# Patient Record
Sex: Male | Born: 1955
Health system: Southern US, Community
[De-identification: ages and names within clinical notes are randomized; demographics above are authoritative.]

## PROBLEM LIST (undated history)

## (undated) DIAGNOSIS — H269 Unspecified cataract: Secondary | ICD-10-CM

## (undated) HISTORY — PX: EYE SURGERY: SHX253

## (undated) HISTORY — PX: NOSE SURGERY: SHX723

## (undated) HISTORY — DX: Unspecified cataract: H26.9

---

## 2011-05-11 ENCOUNTER — Ambulatory Visit (INDEPENDENT_AMBULATORY_CARE_PROVIDER_SITE_OTHER): Payer: BC Managed Care – PPO | Admitting: Family Medicine

## 2011-05-11 ENCOUNTER — Ambulatory Visit
Admission: RE | Admit: 2011-05-11 | Discharge: 2011-05-11 | Disposition: A | Discharge status: Home / Self Care | Payer: Self-pay | Source: Ambulatory Visit | Attending: Family Medicine | Admitting: Family Medicine

## 2011-05-11 DIAGNOSIS — R1084 Generalized abdominal pain: Secondary | ICD-10-CM

## 2011-05-11 DIAGNOSIS — Z0000 Encounter for general adult medical examination without abnormal findings: Secondary | ICD-10-CM

## 2011-05-11 LAB — COMPREHENSIVE METABOLIC PANEL
ALT: 22 U/L (ref 0–53)
AST: 21 U/L (ref 0–37)
Albumin: 4.9 g/dL (ref 3.5–5.2)
Alkaline Phosphatase: 50 U/L (ref 39–117)
BUN: 10 mg/dL (ref 6–23)
CO2: 30 mEq/L (ref 19–32)
Calcium: 10.2 mg/dL (ref 8.4–10.5)
Chloride: 104 mEq/L (ref 96–112)
Creat: 0.91 mg/dL (ref 0.50–1.35)
Glucose, Bld: 100 mg/dL — ABNORMAL HIGH (ref 70–99)
Potassium: 4.8 mEq/L (ref 3.5–5.3)
Sodium: 142 mEq/L (ref 135–145)
Total Bilirubin: 0.4 mg/dL (ref 0.3–1.2)
Total Protein: 7.1 g/dL (ref 6.0–8.3)

## 2011-05-11 LAB — POCT CBC
Granulocyte percent: 67.9 %G (ref 37–80)
HCT, POC: 47.2 % (ref 43.5–53.7)
Hemoglobin: 15.5 g/dL (ref 14.1–18.1)
Lymph, poc: 2.1 (ref 0.6–3.4)
MCH, POC: 30.4 pg (ref 27–31.2)
MCHC: 32.8 g/dL (ref 31.8–35.4)
MCV: 92.5 fL (ref 80–97)
MID (cbc): 0.4 (ref 0–0.9)
MPV: 10.7 fL (ref 0–99.8)
POC Granulocyte: 5.4 (ref 2–6.9)
POC LYMPH PERCENT: 26.5 %L (ref 10–50)
POC MID %: 5.6 %M (ref 0–12)
Platelet Count, POC: 312 10*3/uL (ref 142–424)
RBC: 5.1 M/uL (ref 4.69–6.13)
RDW, POC: 12.3 %
WBC: 7.9 10*3/uL (ref 4.6–10.2)

## 2011-05-11 LAB — LIPID PANEL
Cholesterol: 183 mg/dL (ref 0–200)
HDL: 40 mg/dL (ref >39)
LDL Cholesterol: 96 mg/dL (ref 0–99)
Total CHOL/HDL Ratio: 4.6 Ratio
Triglycerides: 233 mg/dL — ABNORMAL HIGH (ref <150)
VLDL: 47 mg/dL — ABNORMAL HIGH (ref 0–40)

## 2011-05-11 LAB — AMYLASE: Amylase: 48 U/L (ref 0–105)

## 2011-05-11 LAB — LIPASE: Lipase: 18 U/L (ref 0–75)

## 2011-05-11 MED ORDER — IOHEXOL 300 MG/ML  SOLN
100.0000 mL | Freq: Once | INTRAMUSCULAR | Status: AC | PRN
  Administered 2011-05-11: 100 mL via INTRAVENOUS

## 2011-05-11 MED ORDER — IOHEXOL 300 MG/ML  SOLN
30.0000 mL | Freq: Once | INTRAMUSCULAR | Status: AC | PRN
  Administered 2011-05-11: 30 mL via ORAL

## 2011-05-11 NOTE — Progress Notes (Signed)
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 Developing began about one month ago and occurs episodically. The pain is quite intense and is in the periumbilical area lasts anywhere from a few minutes to over an hour and is quite intense stopping him from working., And its associated with some nausea. He has not vomited. He's had no change in his bowel Pattern.  He's had no colonoscopy in the past. This man never complains about his medical problems and is the first medical visit a long time for him. Subsequent abdominal pain becoming more intense, more frequent and are now occurring almost every day.:   Objective: Healthy appearing adult male in no acute distress. He does appear worries.  HEENT: Unremarkable  Heart: Regular no murmur  Chest: Clear no rhonchi or rales.  Abdomen: Patient is a very small umbilical hernia which is nontender. He is however tender just superior to the umbilicus and there is fullness was perception of a deep irregular mass in the left central abdomen. Can't tell if this is bowel or for some other structure. He has no rebound and no guarding.  Results for orders placed in visit on 05/11/11  POCT CBC      Component Value Range   WBC 7.9  4.6 - 10.2 (K/uL)   Lymph, poc 2.1  0.6 - 3.4    POC LYMPH PERCENT 26.5  10 - 50 (%L)   MID (cbc) 0.4  0 - 0.9    POC MID % 5.6  0 - 12 (%M)   POC Granulocyte 5.4  2 - 6.9    Granulocyte percent 67.9  37 - 80 (%G)   RBC 5.10  4.69 - 6.13 (M/uL)   Hemoglobin 15.5  14.1 - 18.1 (g/dL)   HCT, POC 65.7  84.6 - 53.7 (%)   MCV 92.5  80 - 97 (fL)   MCH, POC 30.4  27 - 31.2 (pg)   MCHC 32.8  31.8 - 35.4 (g/dL)   RDW, POC 96.2     Platelet Count, POC 312  142 - 424 (K/uL)   MPV 10.7  0 - 99.8 (fL)   A:  I am very concerned with the crescendo nature of these episodes in the context of a very healthy man with no known risk factors for GI disease.  P:  CT abd/pelvis with contrast today Nexium Rx  written Amylase and lipase, CMET  pending

## 2011-05-11 NOTE — Progress Notes (Signed)
  Subjective:    Patient ID: David [name], male    DOB: [birth year], 56 y.o.   MRN: [id number]  HPI    Review of Systems     Objective:   Physical Exam        Assessment & Plan:   Got call report from [facility] - reviewed and gave pt results.  Pt to get nexium filled and I will have Dr Milus Glazier call him with next step.

## 2011-05-12 ENCOUNTER — Telehealth: Payer: Self-pay

## 2011-05-12 LAB — H. PYLORI ANTIBODY, IGG: H Pylori IgG: <0.4 {ISR}

## 2011-05-12 NOTE — Telephone Encounter (Signed)
Patient given normal results of CT and blood work.  He is comfortable and has started his Nexium.  He will get culturelle and start bid.  He will call if symptoms recur.

## 2011-05-12 NOTE — Telephone Encounter (Signed)
JESSICA FROM [facility] STATES THEY HAVE TO CALL IN REQUEST SINCE THIS IS A NEW RXN. PLEASE CALL [phone] AND PT WOULD LIKE NEXIUM 40MG S

## 2017-12-18 ENCOUNTER — Encounter: Payer: Self-pay | Admitting: Family Medicine

## 2017-12-18 ENCOUNTER — Other Ambulatory Visit: Payer: Self-pay

## 2017-12-18 ENCOUNTER — Ambulatory Visit (INDEPENDENT_AMBULATORY_CARE_PROVIDER_SITE_OTHER): Payer: BLUE CROSS/BLUE SHIELD

## 2017-12-18 ENCOUNTER — Ambulatory Visit: Payer: BLUE CROSS/BLUE SHIELD | Admitting: Family Medicine

## 2017-12-18 VITALS — BP 138/87 | HR 67 | Temp 98.5°F | Ht 71.0 in | Wt 172.4 lb

## 2017-12-18 DIAGNOSIS — M545 Low back pain, unspecified: Secondary | ICD-10-CM

## 2017-12-18 MED ORDER — CYCLOBENZAPRINE HCL 10 MG PO TABS: 5.0000 mg | ORAL_TABLET | Freq: Three times a day (TID) | ORAL | 0 refills | Status: DC | PRN

## 2017-12-18 NOTE — Progress Notes (Signed)
11/5/20194:15 PM  David [name], David [name], 62 y.o. [name] [id number]  Chief Complaint  Patient presents with  . Back Pain     hurt lifting at home 3 wks ago. Felt somewhat of a popping when it happened. Takes motrin for the pain. Does nightly exercise that may have irritated the pain more    HPI:   Patient is a 62 y.o. [name]  who presents today for back pain  About 3-4 weeks ago lifted a boat motor and wen he overstretched he heard a "pop" and since then having pain left lower back Also was doing exercise every night, has stopped as he was worried it might be making things worse Sitting makes it worse No radiating pain, no numbness, tingling, weakness, changes in bowel or bladder function ibu 800mg  and heating pads helps Has done this before in the past but resolved in a week   Fall Risk  12/18/2017  Falls in the past year? 0     No flowsheet data found.  No Known Allergies  Prior to Admission medications   Not on File    No past medical history on file.  History reviewed. No pertinent surgical history.  Social History   Tobacco Use  . Smoking status: Never Smoker  . Smokeless tobacco: Never Used  Substance Use Topics  . Alcohol use: Never    Frequency: Never    Family History  Problem Relation Age of Onset  . Hyperlipidemia Mother   . Alzheimer's disease Mother   . Diabetes Father   . Hyperlipidemia Father     ROS Per hpi  OBJECTIVE:  Blood pressure 138/87, pulse 67, temperature 98.5 F (36.9 C), temperature source Oral, height 5\' 11"  (1.803 m), weight 172 lb 6.4 oz (78.2 kg), SpO2 97 %. Body mass index is 24.04 kg/m.   Physical Exam  Constitutional: He is oriented to person, place, and time. He appears well-developed and well-nourished.  HENT:  Head: Normocephalic and atraumatic.  Right Ear: Hearing, tympanic membrane, external ear and ear canal normal.  Left Ear: Hearing, tympanic membrane, external ear and ear canal normal.  Mouth/Throat:  Oropharynx is clear and moist. No oropharyngeal exudate.  Eyes: Pupils are equal, round, and reactive to light. Conjunctivae and EOM are normal.  Neck: Neck supple. No thyromegaly present.  Cardiovascular: Normal rate, regular rhythm, normal heart sounds and intact distal pulses. Exam reveals no gallop and no friction rub.  No murmur heard. Pulmonary/Chest: Effort normal and breath sounds normal. He has no wheezes. He has no rhonchi. He has no rales.  Abdominal: Soft. Bowel sounds are normal. He exhibits no distension and no mass. There is no tenderness.  Musculoskeletal: He exhibits no edema.       Thoracic back: Normal.       Lumbar back: He exhibits tenderness (left lower back). He exhibits normal range of motion, no bony tenderness and no spasm.  Lymphadenopathy:    He has no cervical adenopathy.  Neurological: He is alert and oriented to person, place, and time. He has normal strength and normal reflexes. Gait normal.  Neg BLE SLR  Skin: Skin is warm and dry.  Psychiatric: He has a normal mood and affect.  Nursing note and vitals reviewed.    Dg Lumbar Spine Complete  Result Date: 12/18/2017 CLINICAL DATA:  Low back pain over the last 4 weeks following minor trauma. EXAM: LUMBAR SPINE - COMPLETE 4+ VIEW COMPARISON:  CT 05/11/2011 FINDINGS: Five lumbar type vertebral bodies show normal  alignment. No disc space narrowing. No evidence of fracture. No significant facet arthropathy. No pars defect. Sacroiliac joints appear normal. IMPRESSION: Negative radiographs.  No abnormality seen to explain pain. Electronically Signed   By: Nelson Chimes M.D.   On: 12/18/2017 16:49     ASSESSMENT and PLAN  1. Left-sided low back pain without sciatica, unspecified chronicity Discussed supportive measures, new meds r/se/b and RTC precautions. Patient educational handout given. - DG Lumbar Spine Complete; Future  Other orders - cyclobenzaprine (FLEXERIL) 10 MG tablet; Take 0.5-1 tablets (5-10 mg  total) by mouth 3 (three) times daily as needed for muscle spasms.  Return if symptoms worsen or fail to improve.    Rutherford Guys, MD [facility] [place], [place] [address] Ph.  [phone] Fax [phone]

## 2017-12-18 NOTE — Patient Instructions (Addendum)
If you have lab work done today you will be contacted with your lab results within the next 2 weeks.  If you have not heard from [place] then please contact us. The fastest way to get your results is to register for My Chart.   IF you received an x-ray today, you will receive an invoice from [facility]. Please contact [facility] at [phone] with questions or concerns regarding your invoice.   IF you received labwork today, you will receive an invoice from [place]. Please contact [facility] at [phone] with questions or concerns regarding your invoice.   Our billing staff will not be able to assist you with questions regarding bills from these companies.  You will be contacted with the lab results as soon as they are available. The fastest way to get your results is to activate your My Chart account. Instructions are located on the last page of this paperwork. If you have not heard from [place] regarding the results in 2 weeks, please contact this office.     Low Back Strain Rehab Ask your health care provider which exercises are safe for you. Do exercises exactly as told by your health care provider and adjust them as directed. It is normal to feel mild stretching, pulling, tightness, or discomfort as you do these exercises, but you should stop right away if you feel sudden pain or your pain gets worse. Do not begin these exercises until told by your health care provider. Stretching and range of motion exercises These exercises warm up your muscles and joints and improve the movement and flexibility of your back. These exercises also help to relieve pain, numbness, and tingling. Exercise A: Single knee to chest  1. Lie on your back on a firm surface with both legs straight. 2. Bend one of your knees. Use your hands to move your knee up toward your chest until you feel a gentle stretch in your lower back and buttock. ? Hold your leg in this position by holding onto the  front of your knee. ? Keep your other leg as straight as possible. 3. Hold for __________ seconds. 4. Slowly return to the starting position. 5. Repeat with your other leg. Repeat __________ times. Complete this exercise __________ times a day. Exercise B: Prone extension on elbows  1. Lie on your abdomen on a firm surface. 2. Prop yourself up on your elbows. 3. Use your arms to help lift your chest up until you feel a gentle stretch in your abdomen and your lower back. ? This will place some of your body weight on your elbows. If this is uncomfortable, try stacking pillows under your chest. ? Your hips should stay down, against the surface that you are lying on. Keep your hip and back muscles relaxed. 4. Hold for __________ seconds. 5. Slowly relax your upper body and return to the starting position. Repeat __________ times. Complete this exercise __________ times a day. Strengthening exercises These exercises build strength and endurance in your back. Endurance is the ability to use your muscles for a long time, even after they get tired. Exercise C: Pelvic tilt 1. Lie on your back on a firm surface. Bend your knees and keep your feet flat. 2. Tense your abdominal muscles. Tip your pelvis up toward the ceiling and flatten your lower back into the floor. ? To help with this exercise, you may place a small towel under your lower back and try to push your back into the  towel. 3. Hold for __________ seconds. 4. Let your muscles relax completely before you repeat this exercise. Repeat __________ times. Complete this exercise __________ times a day. Exercise D: Alternating arm and leg raises  1. Get on your hands and knees on a firm surface. If you are on a hard floor, you may want to use padding to cushion your knees, such as an exercise mat. 2. Line up your arms and legs. Your hands should be below your shoulders, and your knees should be below your hips. 3. Lift your left leg behind you.  At the same time, raise your right arm and straighten it in front of you. ? Do not lift your leg higher than your hip. ? Do not lift your arm higher than your shoulder. ? Keep your abdominal and back muscles tight. ? Keep your hips facing the ground. ? Do not arch your back. ? Keep your balance carefully, and do not hold your breath. 4. Hold for __________ seconds. 5. Slowly return to the starting position and repeat with your right leg and your left arm. Repeat __________ times. Complete this exercise __________times a day. Exercise J: Single leg lower with bent knees 1. Lie on your back on a firm surface. 2. Tense your abdominal muscles and lift your feet off the floor, one foot at a time, so your knees and hips are bent in an "L" shape (at about 90 degrees). ? Your knees should be over your hips and your lower legs should be parallel to the floor. 3. Keeping your abdominal muscles tense and your knee bent, slowly lower one of your legs so your toe touches the ground. 4. Lift your leg back up to return to the starting position. ? Do not hold your breath. ? Do not let your back arch. Keep your back flat against the ground. 5. Repeat with your other leg. Repeat __________ times. Complete this exercise __________ times a day. Posture and body mechanics  Body mechanics refers to the movements and positions of your body while you do your daily activities. Posture is part of body mechanics. Good posture and healthy body mechanics can help to relieve stress in your body's tissues and joints. Good posture means that your spine is in its natural S-curve position (your spine is neutral), your shoulders are pulled back slightly, and your head is not tipped forward. The following are general guidelines for applying improved posture and body mechanics to your everyday activities. Standing   When standing, keep your spine neutral and your feet about hip-width apart. Keep a slight bend in your knees.  Your ears, shoulders, and hips should line up.  When you do a task in which you stand in one place for a long time, place one foot up on a stable object that is 2-4 inches (5-10 cm) high, such as a footstool. This helps keep your spine neutral. Sitting   When sitting, keep your spine neutral and keep your feet flat on the floor. Use a footrest, if necessary, and keep your thighs parallel to the floor. Avoid rounding your shoulders, and avoid tilting your head forward.  When working at a desk or a computer, keep your desk at a height where your hands are slightly lower than your elbows. Slide your chair under your desk so you are close enough to maintain good posture.  When working at a computer, place your monitor at a height where you are looking straight ahead and you do not have to tilt  your head forward or downward to look at the screen. Resting   When lying down and resting, avoid positions that are most painful for you.  If you have pain with activities such as sitting, bending, stooping, or squatting (flexion-based activities), lie in a position in which your body does not bend very much. For example, avoid curling up on your side with your arms and knees near your chest (fetal position).  If you have pain with activities such as standing for a long time or reaching with your arms (extension-based activities), lie with your spine in a neutral position and bend your knees slightly. Try the following positions: ? Lying on your side with a pillow between your knees. ? Lying on your back with a pillow under your knees. Lifting   When lifting objects, keep your feet at least shoulder-width apart and tighten your abdominal muscles.  Bend your knees and hips and keep your spine neutral. It is important to lift using the strength of your legs, not your back. Do not lock your knees straight out.  Always ask for help to lift heavy or awkward objects. This information is not intended to  replace advice given to you by your health care provider. Make sure you discuss any questions you have with your health care provider. Document Released: 01/30/2005 Document Revised: 10/07/2015 Document Reviewed: 11/11/2014 Elsevier Interactive Patient Education  [employer].

## 2018-01-12 ENCOUNTER — Other Ambulatory Visit: Payer: Self-pay

## 2018-01-12 ENCOUNTER — Encounter: Payer: Self-pay | Admitting: Family Medicine

## 2018-01-12 ENCOUNTER — Ambulatory Visit: Payer: BLUE CROSS/BLUE SHIELD | Admitting: Family Medicine

## 2018-01-12 VITALS — BP 123/84 | HR 81 | Temp 97.7°F | Ht 71.0 in | Wt 168.0 lb

## 2018-01-12 DIAGNOSIS — J208 Acute bronchitis due to other specified organisms: Secondary | ICD-10-CM

## 2018-01-12 DIAGNOSIS — R05 Cough: Secondary | ICD-10-CM

## 2018-01-12 DIAGNOSIS — R059 Cough, unspecified: Secondary | ICD-10-CM

## 2018-01-12 MED ORDER — PREDNISONE 20 MG PO TABS: ORAL_TABLET | ORAL | 0 refills | Status: DC

## 2018-01-12 MED ORDER — ALBUTEROL SULFATE HFA 108 (90 BASE) MCG/ACT IN AERS: 2.0000 | INHALATION_SPRAY | RESPIRATORY_TRACT | 1 refills | Status: DC | PRN

## 2018-01-12 MED ORDER — HYDROCODONE-HOMATROPINE 5-1.5 MG/5ML PO SYRP: 5.0000 mL | ORAL_SOLUTION | ORAL | 0 refills | Status: DC | PRN

## 2018-01-12 NOTE — Patient Instructions (Addendum)
Drink plenty of fluids  Take hycodan cough syrup 1 teaspoon every 4 hours if needed.  It will cause drowsiness.  Use albuterol inhaler 2 puffs every 4-6 hours as needed for cough or wheeze or chest tightness.  If not improving with the inhaler use prednisone 20 mg 3 pills daily (taken all at once) for 3 days.  Return if worse.      If you have lab work done today you will be contacted with your lab results within the next 2 weeks.  If you have not heard from [place] then please contact us. The fastest way to get your results is to register for My Chart.   IF you received an x-ray today, you will receive an invoice from [facility]. Please contact [facility] at [phone] with questions or concerns regarding your invoice.   IF you received labwork today, you will receive an invoice from [place]. Please contact [facility] at [phone] with questions or concerns regarding your invoice.   Our billing staff will not be able to assist you with questions regarding bills from these companies.  You will be contacted with the lab results as soon as they are available. The fastest way to get your results is to activate your My Chart account. Instructions are located on the last page of this paperwork. If you have not heard from [place] regarding the results in 2 weeks, please contact this office.

## 2018-01-12 NOTE — Progress Notes (Signed)
Patient ID: David [name], male    DOB: [birth year]  Age: 62 y.o. MRN: [id number]  Chief Complaint  Patient presents with  . Cough    continuous cough for 4 days taking mucinex. Cough is not productive, making throat sore    Subjective:  62 year old man who comes in today with complaint of having a 3 or 4-day cough.  He usually does not get ill.  His wife is not sick.  He works as a Dealer.  He does not smoke.  He has not been running any fever he does not have any nasal congestion to speak of.  He does have a little sore throat, probably from coughing hard.  He is not able to cough up anything though he feels like his chest is tight and full of mucus.  He has not had his flu shot this year.   Current allergies, medications, problem list, past/family and social histories reviewed.  Objective:  BP 123/84 (BP Location: Left Arm, Patient Position: Sitting, Cuff Size: Normal)   Pulse 81   Temp 97.7 F (36.5 C) (Oral)   Ht 5\' 11"  (1.803 m)   Wt 168 lb (76.2 kg)   SpO2 96%   BMI 23.43 kg/m   No acute distress.  He says it is calm down a little bit from heart was a couple of hours ago.  He had to sit up in the night.  His TMs are normal.  Throat has a little edema of the uvula and mild erythema.  Neck supple without significant nodes.  Chest is clear to auscultation.  Heart regular without any murmurs.  On forced expiration he has some tightness and prolonged expiratory phase with minimal wheeze.  Assessment & Plan:   Assessment: 1. Cough   2. Acute viral bronchitis       Plan: See instructions  No orders of the defined types were placed in this encounter.   Meds ordered this encounter  Medications  . predniSONE (DELTASONE) 20 MG tablet    Sig: Take 3 pills daily for 3 days for inflammation in chest.    Dispense:  9 tablet    Refill:  0  . albuterol (PROVENTIL HFA;VENTOLIN HFA) 108 (90 Base) MCG/ACT inhaler    Sig: Inhale 2 puffs into the lungs every 4 (four) hours as  needed for wheezing or shortness of breath (cough, shortness of breath or wheezing.).    Dispense:  1 Inhaler    Refill:  1  . HYDROcodone-homatropine (HYCODAN) 5-1.5 MG/5ML syrup    Sig: Take 5 mLs by mouth every 4 (four) hours as needed.    Dispense:  120 mL    Refill:  0         Patient Instructions   Drink plenty of fluids  Take hycodan cough syrup 1 teaspoon every 4 hours if needed.  It will cause drowsiness.  Use albuterol inhaler 2 puffs every 4-6 hours as needed for cough or wheeze or chest tightness.  If not improving with the inhaler use prednisone 20 mg 3 pills daily (taken all at once) for 3 days.  Return if worse.      If you have lab work done today you will be contacted with your lab results within the next 2 weeks.  If you have not heard from [place] then please contact us. The fastest way to get your results is to register for My Chart.   IF you received an x-ray today, you will receive an invoice from  [facility]. Please contact [facility] at [phone] with questions or concerns regarding your invoice.   IF you received labwork today, you will receive an invoice from [place]. Please contact [facility] at [phone] with questions or concerns regarding your invoice.   Our billing staff will not be able to assist you with questions regarding bills from these companies.  You will be contacted with the lab results as soon as they are available. The fastest way to get your results is to activate your My Chart account. Instructions are located on the last page of this paperwork. If you have not heard from [place] regarding the results in 2 weeks, please contact this office.        Return if symptoms worsen or fail to improve.   Ruben Reason, MD 01/12/2018

## 2018-01-18 ENCOUNTER — Ambulatory Visit: Payer: Self-pay

## 2018-01-18 NOTE — Telephone Encounter (Signed)
Pt. Reports he saw Dr. Linna Darner 01/12/18 with cough and congestion. Completed his steroid and is still coughing - non-productive. States he feels like "something is in there, it's just not breaking loose." Prescribed cough syrup helps "a little." Only sleeping 2-3 hours a night due to cough." No available appointments per Lavella Lemons. Pt. Would rather not go to UC, "especially since I just saw Dr. Linna Darner." Pt. May try an E-visit on My Chart.Wants to know if "something else can be called in." Please advise pt.  Reason for Disposition . [1] Continuous (nonstop) coughing interferes with work or school AND [2] no improvement using cough treatment per protocol  Answer Assessment - Initial Assessment Questions 1. ONSET: "When did the cough begin?"      Started 1 week ago 2. SEVERITY: "How bad is the cough today?"      Severe - can't sleep 3. RESPIRATORY DISTRESS: "Describe your breathing."      No distress 4. FEVER: "Do you have a fever?" If so, ask: "What is your temperature, how was it measured, and when did it start?"     No 5. HEMOPTYSIS: "Are you coughing up any blood?" If so ask: "How much?" (flecks, streaks, tablespoons, etc.)     No 6. TREATMENT: "What have you done so far to treat the cough?" (e.g., meds, fluids, humidifier)     Steroid and cough syrup 7. CARDIAC HISTORY: "Do you have any history of heart disease?" (e.g., heart attack, congestive heart failure)      No 8. LUNG HISTORY: "Do you have any history of lung disease?"  (e.g., pulmonary embolus, asthma, emphysema)     No 9. PE RISK FACTORS: "Do you have a history of blood clots?" (or: recent major surgery, recent prolonged travel, bedridden)     No 10. OTHER SYMPTOMS: "Do you have any other symptoms? (e.g., runny nose, wheezing, chest pain)       Wheezing at times 11. PREGNANCY: "Is there any chance you are pregnant?" "When was your last menstrual period?"       n/a 12. TRAVEL: "Have you traveled out of the country in the last month?"  (e.g., travel history, exposures)       No  Protocols used: COUGH - ACUTE NON-PRODUCTIVE-A-AH

## 2018-01-19 DIAGNOSIS — J40 Bronchitis, not specified as acute or chronic: Secondary | ICD-10-CM | POA: Diagnosis not present

## 2019-03-24 DIAGNOSIS — C4491 Basal cell carcinoma of skin, unspecified: Secondary | ICD-10-CM

## 2019-03-24 DIAGNOSIS — C44519 Basal cell carcinoma of skin of other part of trunk: Secondary | ICD-10-CM | POA: Diagnosis not present

## 2019-03-24 DIAGNOSIS — C44212 Basal cell carcinoma of skin of right ear and external auricular canal: Secondary | ICD-10-CM | POA: Diagnosis not present

## 2019-03-24 HISTORY — DX: Basal cell carcinoma of skin, unspecified: C44.91

## 2019-04-10 ENCOUNTER — Encounter: Payer: BLUE CROSS/BLUE SHIELD | Admitting: Family Medicine

## 2019-04-22 ENCOUNTER — Encounter: Payer: Self-pay | Admitting: Family Medicine

## 2019-04-22 ENCOUNTER — Other Ambulatory Visit: Payer: Self-pay

## 2019-04-22 ENCOUNTER — Ambulatory Visit (INDEPENDENT_AMBULATORY_CARE_PROVIDER_SITE_OTHER): Payer: BC Managed Care – PPO | Admitting: Family Medicine

## 2019-04-22 VITALS — BP 118/70 | HR 60 | Temp 98.0°F | Ht 71.0 in | Wt 173.0 lb

## 2019-04-22 DIAGNOSIS — Z13220 Encounter for screening for lipoid disorders: Secondary | ICD-10-CM | POA: Diagnosis not present

## 2019-04-22 DIAGNOSIS — Z125 Encounter for screening for malignant neoplasm of prostate: Secondary | ICD-10-CM | POA: Diagnosis not present

## 2019-04-22 DIAGNOSIS — Z0000 Encounter for general adult medical examination without abnormal findings: Secondary | ICD-10-CM | POA: Diagnosis not present

## 2019-04-22 DIAGNOSIS — Z1329 Encounter for screening for other suspected endocrine disorder: Secondary | ICD-10-CM

## 2019-04-22 DIAGNOSIS — Z13228 Encounter for screening for other metabolic disorders: Secondary | ICD-10-CM

## 2019-04-22 DIAGNOSIS — Z130 Encounter for screening for diseases of the blood and blood-forming organs and certain disorders involving the immune mechanism: Secondary | ICD-10-CM

## 2019-04-22 NOTE — Patient Instructions (Addendum)
   If you have lab work done today you will be contacted with your lab results within the next 2 weeks.  If you have not heard from us then please contact us. The fastest way to get your results is to register for My Chart.   IF you received an x-ray today, you will receive an invoice from [facility]. Please contact [facility] at [phone] with questions or concerns regarding your invoice.   IF you received labwork today, you will receive an invoice from [facility]. Please contact [facility] at [phone] with questions or concerns regarding your invoice.   Our billing staff will not be able to assist you with questions regarding bills from these companies.  You will be contacted with the lab results as soon as they are available. The fastest way to get your results is to activate your My Chart account. Instructions are located on the last page of this paperwork. If you have not heard from us regarding the results in 2 weeks, please contact this office.     Preventive Care 40-64 Years Old, Male Preventive care refers to lifestyle choices and visits with your health care provider that can promote health and wellness. This includes:  A yearly physical exam. This is also called an annual well check.  Regular dental and eye exams.  Immunizations.  Screening for certain conditions.  Healthy lifestyle choices, such as eating a healthy diet, getting regular exercise, not using drugs or products that contain nicotine and tobacco, and limiting alcohol use. What can I expect for my preventive care visit? Physical exam Your health care provider will check:  Height and weight. These may be used to calculate body mass index (BMI), which is a measurement that tells if you are at a healthy weight.  Heart rate and blood pressure.  Your skin for abnormal spots. Counseling Your health care provider may ask you questions about:  Alcohol, tobacco, and drug use.  Emotional  well-being.  Home and relationship well-being.  Sexual activity.  Eating habits.  Work and work environment. What immunizations do I need?  Influenza (flu) vaccine  This is recommended every year. Tetanus, diphtheria, and pertussis (Tdap) vaccine  You may need a Td booster every 10 years. Varicella (chickenpox) vaccine  You may need this vaccine if you have not already been vaccinated. Zoster (shingles) vaccine  You may need this after age 60. Measles, mumps, and rubella (MMR) vaccine  You may need at least one dose of MMR if you were born in 1957 or later. You may also need a second dose. Pneumococcal conjugate (PCV13) vaccine  You may need this if you have certain conditions and were not previously vaccinated. Pneumococcal polysaccharide (PPSV23) vaccine  You may need one or two doses if you smoke cigarettes or if you have certain conditions. Meningococcal conjugate (MenACWY) vaccine  You may need this if you have certain conditions. Hepatitis A vaccine  You may need this if you have certain conditions or if you travel or work in places where you may be exposed to hepatitis A. Hepatitis B vaccine  You may need this if you have certain conditions or if you travel or work in places where you may be exposed to hepatitis B. Haemophilus influenzae type b (Hib) vaccine  You may need this if you have certain risk factors. Human papillomavirus (HPV) vaccine  If recommended by your health care provider, you may need three doses over 6 months. You may receive vaccines as individual doses   or as more than one vaccine together in one shot (combination vaccines). Talk with your health care provider about the risks and benefits of combination vaccines. What tests do I need? Blood tests  Lipid and cholesterol levels. These may be checked every 5 years, or more frequently if you are over 50 years old.  Hepatitis C test.  Hepatitis B test. Screening  Lung cancer screening.  You may have this screening every year starting at age 55 if you have a 30-pack-year history of smoking and currently smoke or have quit within the past 15 years.  Prostate cancer screening. Recommendations will vary depending on your family history and other risks.  Colorectal cancer screening. All adults should have this screening starting at age 50 and continuing until age 75. Your health care provider may recommend screening at age 45 if you are at increased risk. You will have tests every 1-10 years, depending on your results and the type of screening test.  Diabetes screening. This is done by checking your blood sugar (glucose) after you have not eaten for a while (fasting). You may have this done every 1-3 years.  Sexually transmitted disease (STD) testing. Follow these instructions at home: Eating and drinking  Eat a diet that includes fresh fruits and vegetables, whole grains, lean protein, and low-fat dairy products.  Take vitamin and mineral supplements as recommended by your health care provider.  Do not drink alcohol if your health care provider tells you not to drink.  If you drink alcohol: ? Limit how much you have to 0-2 drinks a day. ? Be aware of how much alcohol is in your drink. In the [place] one drink equals one 12 oz bottle of beer (355 mL), one 5 oz glass of wine (148 mL), or one 1 oz glass of hard liquor (44 mL). Lifestyle  Take daily care of your teeth and gums.  Stay active. Exercise for at least 30 minutes on 5 or more days each week.  Do not use any products that contain nicotine or tobacco, such as cigarettes, e-cigarettes, and chewing tobacco. If you need help quitting, ask your health care provider.  If you are sexually active, practice safe sex. Use a condom or other form of protection to prevent STIs (sexually transmitted infections).  Talk with your health care provider about taking a low-dose aspirin every day starting at age 50. What's next?  Go  to your health care provider once a year for a well check visit.  Ask your health care provider how often you should have your eyes and teeth checked.  Stay up to date on all vaccines. This information is not intended to replace advice given to you by your health care provider. Make sure you discuss any questions you have with your health care provider. Document Revised: 01/24/2018 Document Reviewed: 01/24/2018 Elsevier Patient Education  2020 Elsevier Inc.  

## 2019-04-22 NOTE — Progress Notes (Signed)
3/9/20218:13 AM  David [name] 12-28-1955, 64 y.o., male [id number]  Chief Complaint  Patient presents with  . Annual Exam    patient declined HM tdap, hiv screen, hep c     HPI:   Patient is a 64 y.o. male who presents today for CPE  Colorectal Cancer Screening: undecided Prostate Cancer Screening: today, nocturia x 1-3 HIV Screening: declines Seasonal Influenza Vaccination: declines Td/Tdap Vaccination: declines Pneumococcal Vaccination: at age 12 Zoster Vaccination: undecided Frequency of Dental evaluation: Q6 months Frequency of Eye evaluation: does not wear eyeglasses Sees derm often for non melanoma skin cancer Remains very active at work and home   Hearing Screening   125Hz  250Hz  500Hz  1000Hz  2000Hz  3000Hz  4000Hz  6000Hz  8000Hz   Right ear:           Left ear:             Visual Acuity Screening   Right eye Left eye Both eyes  Without correction: 2040 2025 2025  With correction:       Depression screen [facility] 2/9 04/22/2019 01/12/2018  Decreased Interest 0 0  Down, Depressed, Hopeless 0 0  PHQ - 2 Score 0 0    Fall Risk  04/22/2019 01/12/2018 12/18/2017  Falls in the past year? 0 0 0  Number falls in past yr: 0 - -  Injury with Fall? 0 - -  Follow up Falls evaluation completed - -     No Known Allergies  Prior to Admission medications   Medication Sig Start Date End Date Taking? Authorizing Provider  Cholecalciferol (VITAMIN D) 50 MCG (2000 UT) tablet Take 2,000 Units by mouth daily.   Yes [provider]  Multiple Vitamin (MULTIVITAMIN) tablet Take 1 tablet by mouth daily.   Yes [provider]    No past medical history on file.  No past surgical history on file.  Social History   Tobacco Use  . Smoking status: Never Smoker  . Smokeless tobacco: Never Used  Substance Use Topics  . Alcohol use: Never    Family History  Problem Relation Age of Onset  . Hyperlipidemia Mother   . Alzheimer's disease Mother   . Diabetes  Father   . Hyperlipidemia Father     Review of Systems  Constitutional: Negative for chills, fever, malaise/fatigue and weight loss.  Respiratory: Negative for cough and shortness of breath.   Cardiovascular: Negative for chest pain, palpitations and leg swelling.  Gastrointestinal: Negative for abdominal pain, blood in stool, constipation, diarrhea, melena, nausea and vomiting.  Genitourinary: Negative for frequency and urgency.  Musculoskeletal: Negative for falls.  Neurological: Negative for dizziness, tingling, focal weakness and headaches.  Psychiatric/Behavioral: Negative for depression. The patient is nervous/anxious. The patient does not have insomnia.   All other systems reviewed and are negative.    OBJECTIVE:  Today's Vitals   04/22/19 0801 04/22/19 0817  BP: (!) 143/82 118/70  Pulse: 60   Temp: 98 F (36.7 C)   TempSrc: Temporal   SpO2: 96%   Weight: 173 lb (78.5 kg)   Height: 5' 11"  (1.803 m)    Body mass index is 24.13 kg/m.   Physical Exam Vitals and nursing note reviewed.  Constitutional:      Appearance: He is well-developed.  HENT:     Head: Normocephalic and atraumatic.     Right Ear: Hearing, tympanic membrane, ear canal and external ear normal.     Left Ear: Hearing, tympanic membrane, ear canal and external ear normal.  Mouth/Throat:     Pharynx: No oropharyngeal exudate.  Eyes:     Conjunctiva/sclera: Conjunctivae normal.     Pupils: Pupils are equal, round, and reactive to light.  Neck:     Thyroid: No thyromegaly.  Cardiovascular:     Rate and Rhythm: Normal rate and regular rhythm.     Heart sounds: Normal heart sounds. No murmur. No friction rub. No gallop.   Pulmonary:     Effort: Pulmonary effort is normal.     Breath sounds: Normal breath sounds. No wheezing, rhonchi or rales.  Abdominal:     General: Bowel sounds are normal. There is no distension.     Palpations: Abdomen is soft. There is no mass.     Tenderness: There is no  abdominal tenderness.  Musculoskeletal:        General: Normal range of motion.     Cervical back: Neck supple.     Right lower leg: No edema.     Left lower leg: No edema.  Lymphadenopathy:     Cervical: No cervical adenopathy.  Skin:    General: Skin is warm and dry.  Neurological:     Mental Status: He is alert and oriented to person, place, and time.     Cranial Nerves: No cranial nerve deficit.     Coordination: Coordination normal.     Gait: Gait normal.     Deep Tendon Reflexes: Reflexes are normal and symmetric.  Psychiatric:        Mood and Affect: Mood normal.        Behavior: Behavior normal.     No results found for this or any previous visit (from the past 24 hour(s)).  No results found.   ASSESSMENT and PLAN  1. Annual physical exam No concerns per history or exam. Routine HCM labs ordered. HCM reviewed/discussed. Anticipatory guidance regarding healthy weight, lifestyle and choices given.   2. Screening for prostate cancer - PSA  3. Screening for lipid disorders - Lipid panel  4. Screening for endocrine, metabolic and immunity disorder - CMP14+EGFR  Other orders - Multiple Vitamin (MULTIVITAMIN) tablet; Take 1 tablet by mouth daily. - Cholecalciferol (VITAMIN D) 50 MCG (2000 UT) tablet; Take 2,000 Units by mouth daily.  Return in about 1 year (around 04/21/2020).    Rutherford Guys, MD [facility] [place], [place] [address] Ph.  [phone] Fax [phone]

## 2019-04-23 LAB — CMP14+EGFR
ALT: 23 IU/L (ref 0–44)
AST: 18 IU/L (ref 0–40)
Albumin/Globulin Ratio: 2 (ref 1.2–2.2)
Albumin: 4.7 g/dL (ref 3.8–4.8)
Alkaline Phosphatase: 54 IU/L (ref 39–117)
BUN/Creatinine Ratio: 13 (ref 10–24)
BUN: 12 mg/dL (ref 8–27)
Bilirubin Total: 0.3 mg/dL (ref 0.0–1.2)
CO2: 26 mmol/L (ref 20–29)
Calcium: 9.8 mg/dL (ref 8.6–10.2)
Chloride: 101 mmol/L (ref 96–106)
Creatinine, Ser: 0.95 mg/dL (ref 0.76–1.27)
GFR calc Af Amer: 97 mL/min/{1.73_m2} (ref >59)
GFR calc non Af Amer: 84 mL/min/{1.73_m2} (ref >59)
Globulin, Total: 2.3 g/dL (ref 1.5–4.5)
Glucose: 95 mg/dL (ref 65–99)
Potassium: 5.1 mmol/L (ref 3.5–5.2)
Sodium: 140 mmol/L (ref 134–144)
Total Protein: 7 g/dL (ref 6.0–8.5)

## 2019-04-23 LAB — LIPID PANEL
Chol/HDL Ratio: 6.2 ratio — ABNORMAL HIGH (ref 0.0–5.0)
Cholesterol, Total: 247 mg/dL — ABNORMAL HIGH (ref 100–199)
HDL: 40 mg/dL (ref >39)
LDL Chol Calc (NIH): 150 mg/dL — ABNORMAL HIGH (ref 0–99)
Triglycerides: 309 mg/dL — ABNORMAL HIGH (ref 0–149)
VLDL Cholesterol Cal: 57 mg/dL — ABNORMAL HIGH (ref 5–40)

## 2019-04-23 LAB — PSA: Prostate Specific Ag, Serum: 1.3 ng/mL (ref 0.0–4.0)

## 2019-04-24 ENCOUNTER — Telehealth: Payer: Self-pay | Admitting: Family Medicine

## 2019-04-24 MED ORDER — ATORVASTATIN CALCIUM 10 MG PO TABS: 10.0000 mg | ORAL_TABLET | Freq: Every day | ORAL | 3 refills | Status: DC

## 2019-04-24 NOTE — Telephone Encounter (Signed)
Please call patient regarding his recent lab results .

## 2019-04-24 NOTE — Addendum Note (Signed)
Addended by: Rutherford Guys on: 04/24/2019 12:42 PM   Modules accepted: Orders

## 2019-04-24 NOTE — Telephone Encounter (Signed)
Please Advise

## 2019-04-24 NOTE — Telephone Encounter (Signed)
Labs reviewed in my chart

## 2019-04-26 ENCOUNTER — Encounter: Payer: Self-pay | Admitting: Family Medicine

## 2019-05-15 ENCOUNTER — Telehealth: Payer: Self-pay | Admitting: *Deleted

## 2019-05-15 NOTE — Telephone Encounter (Signed)
Mohs appointment date to patient and 30 minute surgery made June 10th @ 730 with Dr Denna Haggard.

## 2019-06-17 DIAGNOSIS — C44212 Basal cell carcinoma of skin of right ear and external auricular canal: Secondary | ICD-10-CM | POA: Diagnosis not present

## 2019-07-20 IMAGING — DX DG LUMBAR SPINE COMPLETE 4+V
5 series · 5 of 5 positions shown · non-contrast
Comparison: CT 05/11/2011

CLINICAL DATA: Low back pain over the last 4 weeks following minor
trauma.

EXAM:
LUMBAR SPINE - COMPLETE 4+ VIEW

[l-spine ap]
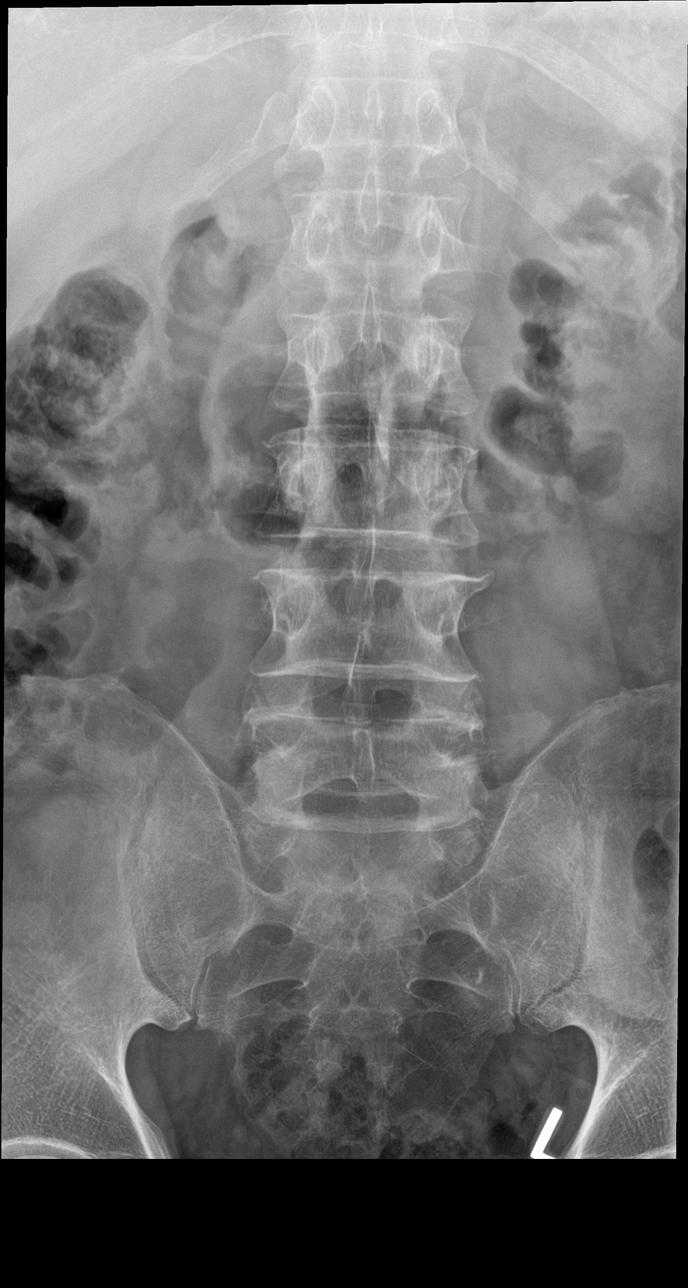

[l-spine obl (1 of 2)]
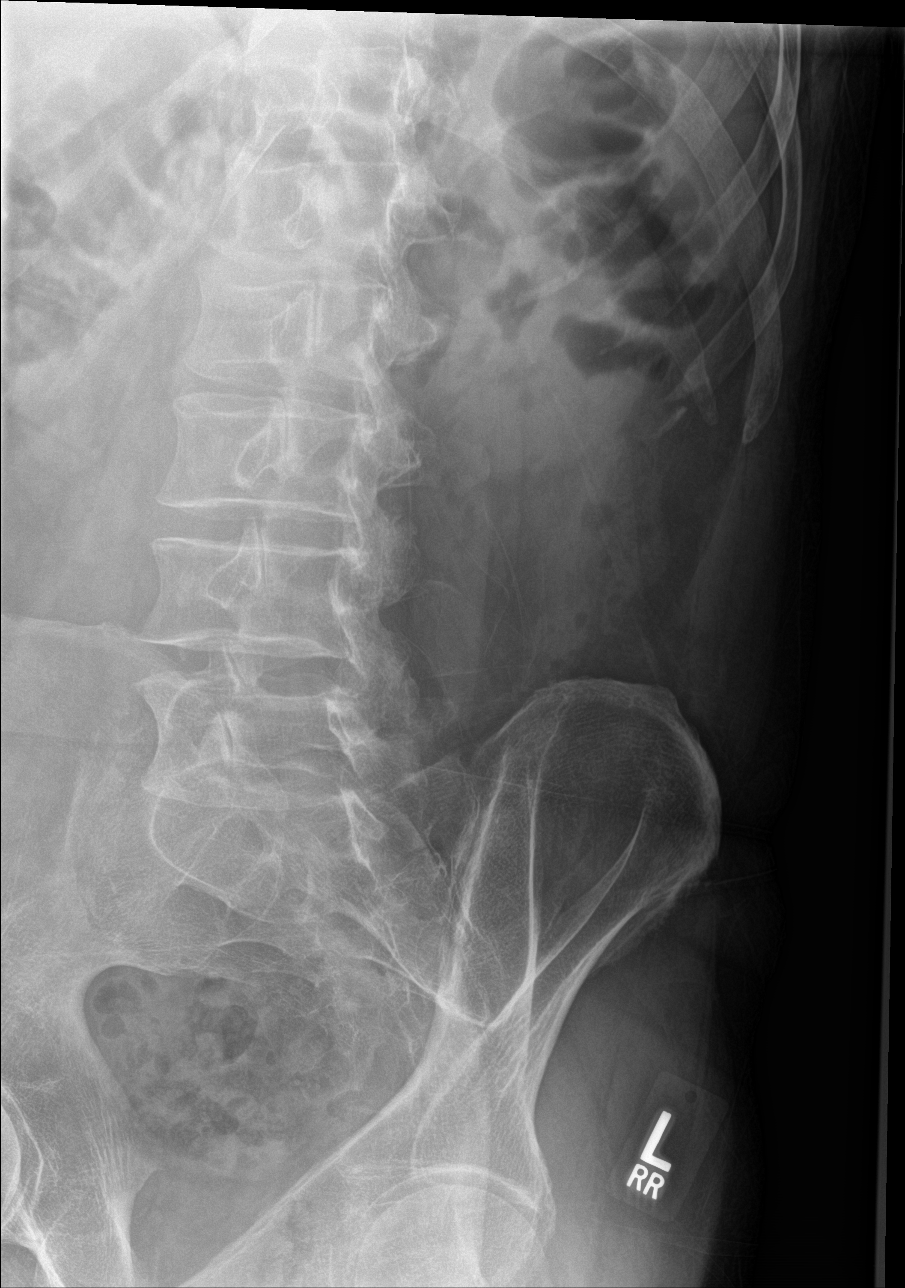

[l-spine obl (2 of 2)]
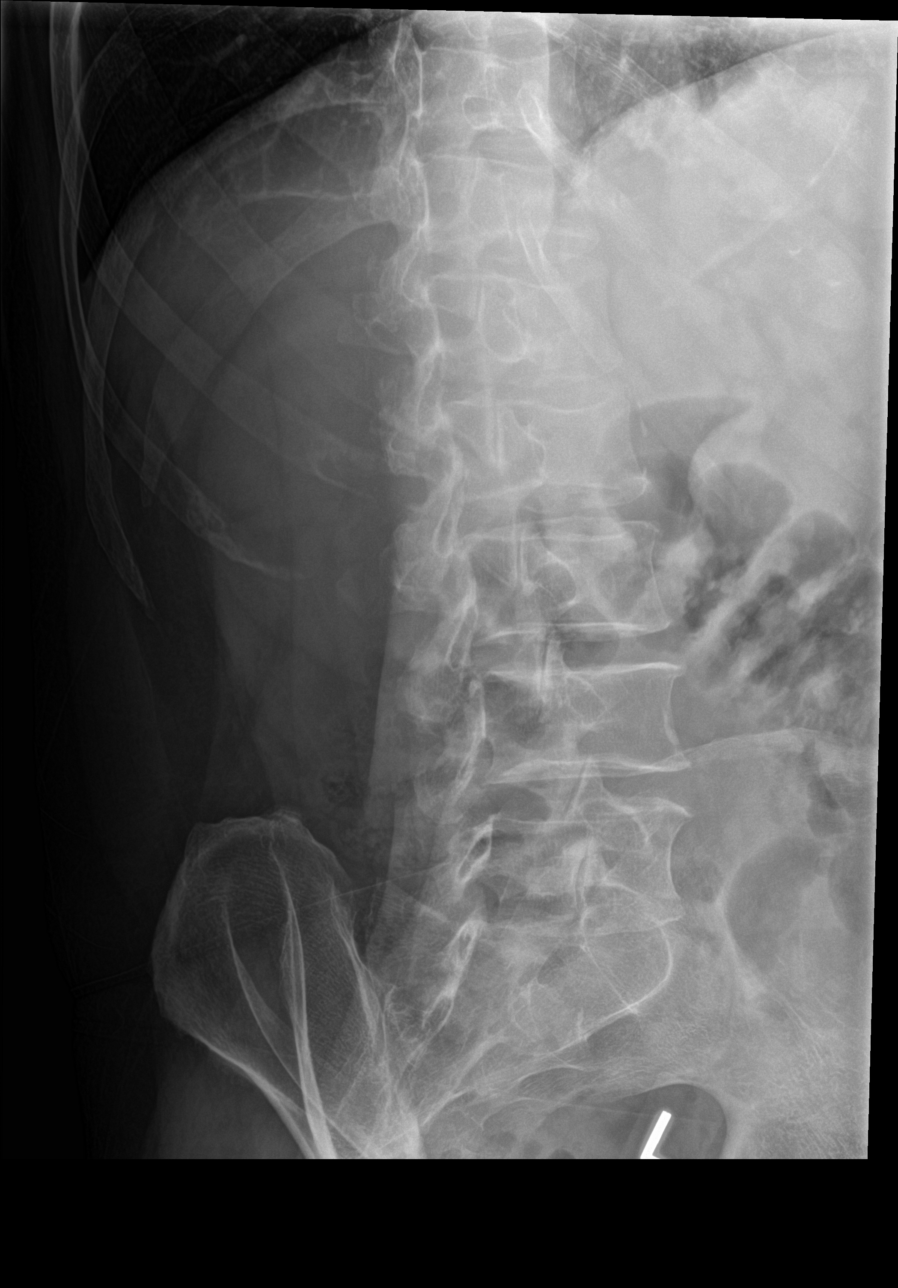

[l-spine lat]
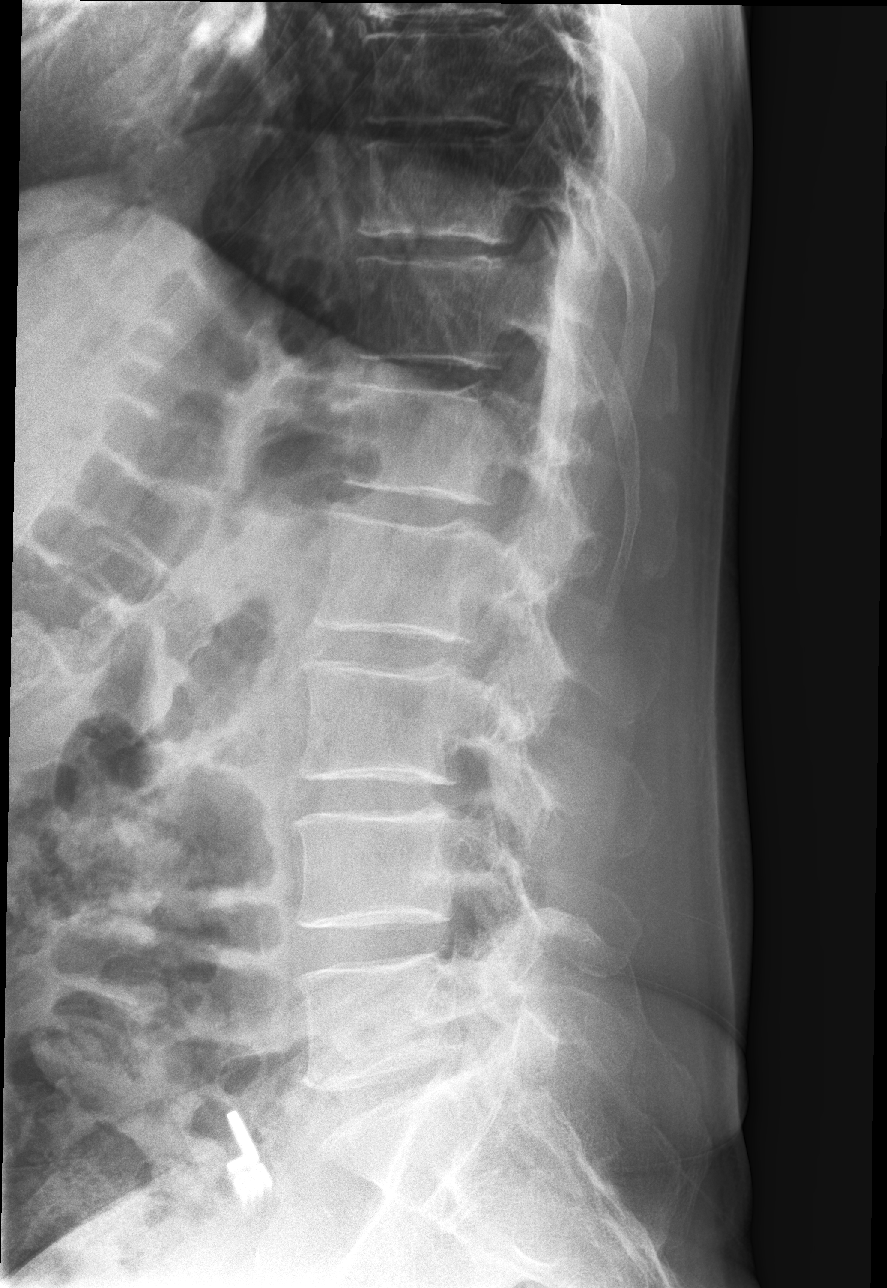

[l-spine l5-s1]
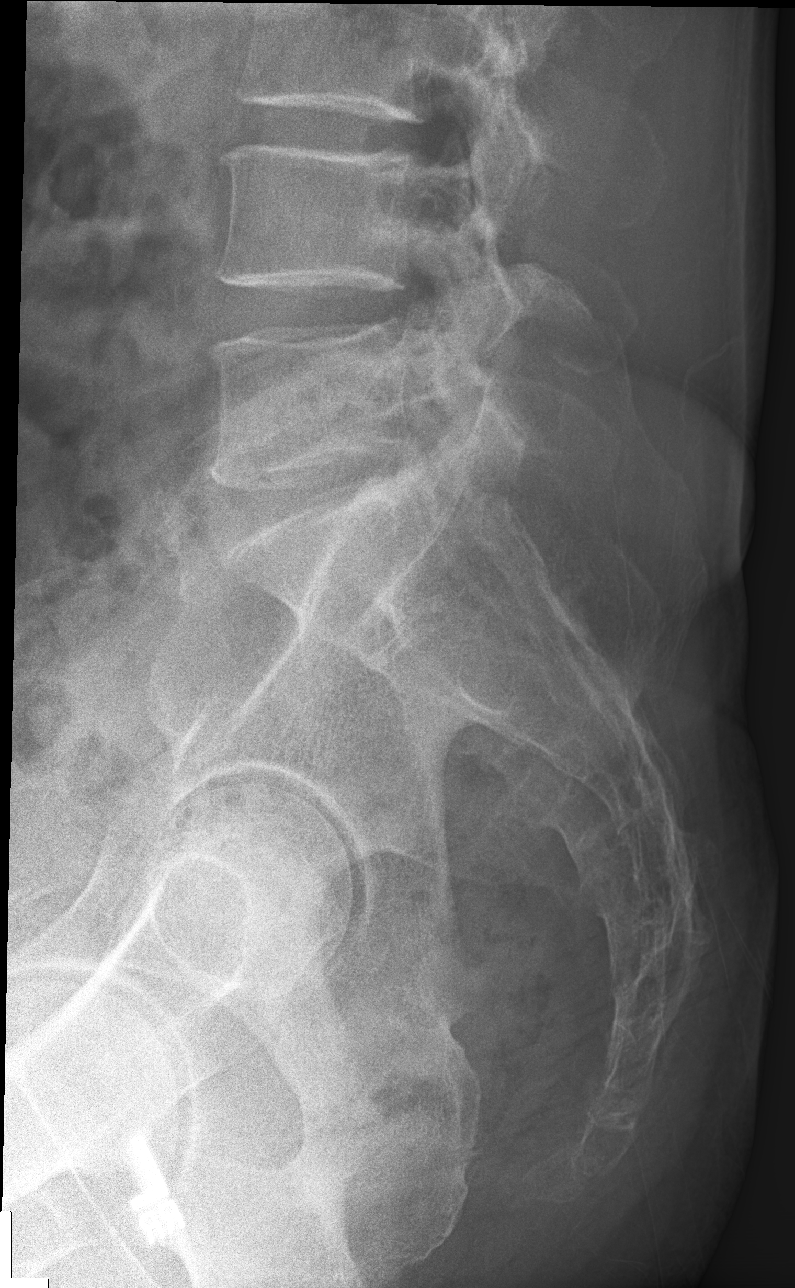

[5 of 5 positions shown; findings below may reference images not displayed]

FINDINGS: Five lumbar type vertebral bodies show normal alignment. No disc
space narrowing. No evidence of fracture. No significant facet
arthropathy. No pars defect. Sacroiliac joints appear normal.
IMPRESSION: Negative radiographs.  No abnormality seen to explain pain.

## 2019-07-24 ENCOUNTER — Encounter: Payer: Self-pay | Admitting: Dermatology

## 2019-07-24 ENCOUNTER — Other Ambulatory Visit: Payer: Self-pay

## 2019-07-24 ENCOUNTER — Ambulatory Visit (INDEPENDENT_AMBULATORY_CARE_PROVIDER_SITE_OTHER): Payer: BC Managed Care – PPO | Admitting: Dermatology

## 2019-07-24 DIAGNOSIS — C44519 Basal cell carcinoma of skin of other part of trunk: Secondary | ICD-10-CM | POA: Diagnosis not present

## 2019-08-18 NOTE — Progress Notes (Signed)
   Follow-Up Visit   Subjective  David [name] is a 64 y.o. male who presents for the following: Procedure (here for treatment- mid paraspinal -bcc ).  BCC Location:  Duration:  Quality:  Associated Signs/Symptoms: Modifying Factors:  Severity:  Timing: Context: For treatment  The following portions of the chart were reviewed this encounter and updated as appropriate:     Objective  Well appearing patient in no apparent distress; mood and affect are within normal limits.  All skin waist up examined.   Assessment & Plan  Basal cell carcinoma (BCC) of skin of other part of torso Mid Paraspinal  Destruction of lesion Complexity: simple   Destruction method: electrodesiccation and curettage   Informed consent: discussed and consent obtained   Timeout:  patient name, date of birth, surgical site, and procedure verified Anesthesia: the lesion was anesthetized in a standard fashion   Anesthetic:  1% lidocaine w/ epinephrine 1-100,000 local infiltration Curettage performed in three different directions: Yes   Curettage cycles:  3 Lesion length (cm):  1.4 Lesion width (cm):  1 Margin per side (cm):  0 Final wound size (cm):  1.4 Hemostasis achieved with:  ferric subsulfate Outcome: patient tolerated procedure well with no complications   Post-procedure details: wound care instructions given   Additional details:  Inoculated with parenteral 5% fluorouracil

## 2019-10-21 ENCOUNTER — Other Ambulatory Visit: Payer: Self-pay | Admitting: Family Medicine

## 2019-10-21 MED ORDER — ATORVASTATIN CALCIUM 10 MG PO TABS: 10.0000 mg | ORAL_TABLET | Freq: Every day | ORAL | 0 refills | Status: DC

## 2019-10-21 NOTE — Addendum Note (Signed)
Addended by: Denman George on: 10/21/2019 11:52 AM   Modules accepted: Orders

## 2019-10-21 NOTE — Telephone Encounter (Signed)
PT need a refill  atorvastatin (LIPITOR) 10 MG tablet [[id number]] [id number] [place], [place]  [address] [place] [address]  Phone: [phone] Fax: [phone]

## 2019-10-28 ENCOUNTER — Ambulatory Visit: Payer: Medicare HMO | Admitting: Dermatology

## 2019-10-28 ENCOUNTER — Encounter: Payer: Self-pay | Admitting: Dermatology

## 2019-10-28 ENCOUNTER — Other Ambulatory Visit: Payer: Self-pay

## 2019-10-28 DIAGNOSIS — L821 Other seborrheic keratosis: Secondary | ICD-10-CM | POA: Diagnosis not present

## 2019-10-28 DIAGNOSIS — L738 Other specified follicular disorders: Secondary | ICD-10-CM | POA: Diagnosis not present

## 2019-10-28 DIAGNOSIS — L720 Epidermal cyst: Secondary | ICD-10-CM

## 2019-10-28 DIAGNOSIS — Z85828 Personal history of other malignant neoplasm of skin: Secondary | ICD-10-CM

## 2019-10-28 NOTE — Patient Instructions (Addendum)
Follow up for David [name] date of birth 08/24/1955.  There is no sign either of residual skin cancer from removal in March or any sign of new skin cancer or atypical mole from the waist up.  He has a little milium over his left eyebrow which is unlikely to get bigger and occasionally will disappear on its own.  It does not bother him so he let it be.  There is a sebaceous hyperplasia on the right inner cheek which is safe to leave.  Few seborrheic keratoses on the upper back.  We discussed many issues relating to healthcare.  Unless he sees a change in any spots on his skin, routine general skin check in 1 year.

## 2019-11-23 NOTE — Progress Notes (Signed)
   Follow-Up Visit   Subjective  David [name] is a 64 y.o. male who presents for the following: Follow-up (3 mth f/u back and right sideburn- no concerns).  Recent skin cancer Location: Back Duration:  Quality:  Associated Signs/Symptoms: Modifying Factors:  Severity:  Timing: Context: Would like general skin check (waist up).  Objective  Well appearing patient in no apparent distress; mood and affect are within normal limits.  All skin waist up examined.   Assessment & Plan    Personal history of skin cancer Mid Back  Annual skin check  Milium Left Supraorbital Region  Patient is currently content to leave the spot  Sebaceous hyperplasia Right Malar Cheek  Leave if stable  Seborrheic keratosis (2) Left Upper Back; Right Upper Back  Leave if stable  Follow up for David [name] date of birth 04-23-1955.  There is no sign either of residual skin cancer from removal in March or any sign of new skin cancer or atypical mole from the waist up.  He has a little milium over his left eyebrow which is unlikely to get bigger and occasionally will disappear on its own.  It does not bother him so he let it be.  There is a sebaceous hyperplasia on the right inner cheek which is safe to leave.  Few seborrheic keratoses on the upper back.  We discussed many issues relating to healthcare.  Unless he sees a change in any spots on his skin, routine general skin check in 1 year.   I, [name], MD, have reviewed all documentation for this visit.  The documentation on 11/23/19 for the exam, diagnosis, procedures, and orders are all accurate and complete.

## 2020-01-19 ENCOUNTER — Other Ambulatory Visit: Payer: Self-pay

## 2020-01-19 MED ORDER — ATORVASTATIN CALCIUM 10 MG PO TABS: 10.0000 mg | ORAL_TABLET | Freq: Every day | ORAL | 0 refills | Status: DC

## 2020-01-19 NOTE — Telephone Encounter (Signed)
Medication: atorvastatin (LIPITOR) 10 MG tablet [[id number]]   Has the patient contacted their pharmacy? YES  (Agent: If no, request that the patient contact the pharmacy for the refill.) (Agent: If yes, when and what did the pharmacy advise?)  Preferred Pharmacy (with phone number or street name): [facility] [place] [place], [place] [address] [phone]  Agent: Please be advised that RX refills may take up to 3 business days. We ask that you follow-up with your pharmacy.

## 2020-03-19 ENCOUNTER — Ambulatory Visit (INDEPENDENT_AMBULATORY_CARE_PROVIDER_SITE_OTHER): Payer: Medicare HMO | Admitting: Family Medicine

## 2020-03-19 ENCOUNTER — Other Ambulatory Visit: Payer: Self-pay

## 2020-03-19 ENCOUNTER — Encounter: Payer: Self-pay | Admitting: Family Medicine

## 2020-03-19 VITALS — BP 130/82 | HR 64 | Temp 98.3°F | Ht 71.0 in | Wt 172.0 lb

## 2020-03-19 DIAGNOSIS — M25511 Pain in right shoulder: Secondary | ICD-10-CM | POA: Diagnosis not present

## 2020-03-19 DIAGNOSIS — Z125 Encounter for screening for malignant neoplasm of prostate: Secondary | ICD-10-CM | POA: Diagnosis not present

## 2020-03-19 DIAGNOSIS — Z1211 Encounter for screening for malignant neoplasm of colon: Secondary | ICD-10-CM | POA: Diagnosis not present

## 2020-03-19 DIAGNOSIS — E785 Hyperlipidemia, unspecified: Secondary | ICD-10-CM

## 2020-03-19 DIAGNOSIS — Z23 Encounter for immunization: Secondary | ICD-10-CM | POA: Diagnosis not present

## 2020-03-19 MED ORDER — ATORVASTATIN CALCIUM 10 MG PO TABS: 10.0000 mg | ORAL_TABLET | Freq: Every day | ORAL | 1 refills | Status: DC

## 2020-03-19 NOTE — Progress Notes (Signed)
Subjective:  Patient ID: David [name], male    DOB: September 06, 1955  Age: 65 y.o. MRN: [id number]  CC:  Chief Complaint  Patient presents with  . Transitions Of Care    Pt reports other than some aches and pains that come with age he feels well. Pt reports a pretty active life style.    HPI David [name] presents for  Transition of care.  Previous primary care provider Dr. Pamella Pert  Basal cell carcinoma Followed by dermatology, Dr. Denna Haggard.  History of basal cell carcinoma of torso, R face, treated with electrodesiccation and curettage, July 24, 2019.  Last visit September 2021.  Sebaceous hyperplasia, seborrheic keratosis, milium.  Optho Dr. Talbert Forest. Hx of cataracts, repaired on R. Mild on left.   Hyperlipidemia: Lipitor 10 mg daily started after last physical.  Hypertriglyceridemia noted in March of last year, dietary changes, exercise recommended. Rare cramping at times, but minimal  Bicycle when weather permits, walking and [place].5 acres of land- work at home. No DOE, no CP with exertion. Retired in October, Dealer. Worked for [employer].  No change in diet/exercise since March 2021.  Last ate at 7am - bowl of cereal - shredded wheat, waffle.     Lab Results  Component Value Date   CHOL 247 (H) 04/22/2019   HDL 40 04/22/2019   LDLCALC 150 (H) 04/22/2019   TRIG 309 (H) 04/22/2019   CHOLHDL 6.2 (H) 04/22/2019   Lab Results  Component Value Date   ALT 23 04/22/2019   AST 18 04/22/2019   ALKPHOS 54 04/22/2019   BILITOT 0.3 04/22/2019   Health maintenance Covid vaccine: 3 vaccines - booster last week - Moderna.  Pneumovax - today.  Flu vaccine - today.   Colon screening: no prior colonoscopy. No polyps, bleeding or FH of colon CA. Rare hemorrhoid.   Some soreness in shoulders at times, longstanding. Plans to follow up to discuss further.   The natural history of prostate cancer and ongoing controversy regarding screening and potential treatment outcomes of  prostate cancer has been discussed with the patient. The meaning of a false positive PSA and a false negative PSA has been discussed. He indicates understanding of the limitations of this screening test and wishes to proceed with screening PSA testing.   History There are no problems to display for this patient.  Past Medical History:  Diagnosis Date  . Cataract    Phreesia 03/16/2020  . Nodular basal cell carcinoma (BCC) 03/24/2019   Mid Paraspinal ([id number])  . Nodular basal cell carcinoma (BCC) 03/24/2019   Right Tragus(MOHS)   Past Surgical History:  Procedure Laterality Date  . EYE SURGERY N/A    Phreesia 03/16/2020   No Known Allergies Prior to Admission medications   Medication Sig Start Date End Date Taking? Authorizing Provider  atorvastatin (LIPITOR) 10 MG tablet Take 1 tablet (10 mg total) by mouth daily. 01/19/20  Yes [name], MD  Cholecalciferol (VITAMIN D) 50 MCG (2000 UT) tablet Take 2,000 Units by mouth daily.   Yes [provider]  Multiple Vitamin (MULTIVITAMIN) tablet Take 1 tablet by mouth daily.   Yes [provider]   Social History   Socioeconomic History  . Marital status: Married    Spouse name: Not on file  . Number of children: Not on file  . Years of education: Not on file  . Highest education level: Not on file  Occupational History  . Not on file  Tobacco Use  .  Smoking status: Never Smoker  . Smokeless tobacco: Never Used  Substance and Sexual Activity  . Alcohol use: Never  . Drug use: Never  . Sexual activity: Yes  Other Topics Concern  . Not on file  Social History Narrative  . Not on file   Social Determinants of Health   Financial Resource Strain: Not on file  Food Insecurity: Not on file  Transportation Needs: Not on file  Physical Activity: Not on file  Stress: Not on file  Social Connections: Not on file  Intimate Partner Violence: Not on file    Review of Systems Per HPI.  Per  HPI Objective:   Vitals:   03/19/20 1310 03/19/20 1314  BP: (!) 145/91 130/82  Pulse: 64   Temp: 98.3 F (36.8 C)   TempSrc: Temporal   SpO2: 97%   Weight: 172 lb (78 kg)   Height: 5\' 11"  (1.803 m)     Physical Exam Vitals reviewed.  Constitutional:      Appearance: He is well-developed and well-nourished.  HENT:     Head: Normocephalic and atraumatic.  Eyes:     Extraocular Movements: EOM normal.     Pupils: Pupils are equal, round, and reactive to light.  Neck:     Vascular: No carotid bruit or JVD.  Cardiovascular:     Rate and Rhythm: Normal rate and regular rhythm.     Heart sounds: Normal heart sounds. No murmur heard.   Pulmonary:     Effort: Pulmonary effort is normal.     Breath sounds: Normal breath sounds. No rales.  Musculoskeletal:        General: No edema.     Comments: C-spine pain-free range of motion, no midline bony tenderness Right shoulder no focal bony tenderness.  Full range of motion.  Full rotator cuff strength.  Negative empty can, negative liftoff.  Minimal discomfort with Hawkins, negative Neer's.  Skin:    General: Skin is warm and dry.  Neurological:     Mental Status: He is alert and oriented to person, place, and time.  Psychiatric:        Mood and Affect: Mood and affect and mood normal.        Assessment & Plan:  David [name] is a 65 y.o. male . Hyperlipidemia, unspecified hyperlipidemia type - Plan: atorvastatin (LIPITOR) 10 MG tablet, Lipid panel, Comprehensive metabolic panel  -Overall tolerating Lipitor, option of temporary cessation for a week or two to see if that changed arthralgias, but those are minimal.  RTC precautions.  Check labs.  Screen for colon cancer - Plan: Cologuard  Need for prophylactic vaccination against Streptococcus pneumoniae (pneumococcus) - Plan: Pneumococcal polysaccharide vaccine 23-valent greater than or equal to 2yo subcutaneous/IM  Needs flu shot - Plan: Flu Vaccine QUAD High  Dose(Fluad)  Pain in joint of right shoulder  -Possible rotator cuff tendinosis versus some component of impingement/subacromial bursitis.  Minimal symptoms at present, deferred further work-up at this time but if more symptomatic would consider imaging, possible subacromial injection.  RTC precautions.  6 month follow up.   Meds ordered this encounter  Medications  . atorvastatin (LIPITOR) 10 MG tablet    Sig: Take 1 tablet (10 mg total) by mouth daily.    Dispense:  90 tablet    Refill:  1   Patient Instructions   Good talking with you today.  Continue same dose of cholesterol medication for now, but I will let you know if there are any concerns  on your blood work.  Keep up the good work with low intensity exercise and watching your diet.  Keep follow-up with your dermatologist.  I will order the Cologuard colon cancer screening test that will be delivered to your home.   Pneumonia and flu vaccines given today.  If your shoulder starts to become more symptomatic, or new symptoms I am happy to see you and we can discuss other treatment options.  Let me know if there are questions and take care.    If you have lab work done today you will be contacted with your lab results within the next 2 weeks.  If you have not heard from [place] then please contact us. The fastest way to get your results is to register for My Chart.   IF you received an x-ray today, you will receive an invoice from [facility]. Please contact [facility] at [phone] with questions or concerns regarding your invoice.   IF you received labwork today, you will receive an invoice from [place]. Please contact [facility] at [phone] with questions or concerns regarding your invoice.   Our billing staff will not be able to assist you with questions regarding bills from these companies.  You will be contacted with the lab results as soon as they are available. The fastest way to get your results  is to activate your My Chart account. Instructions are located on the last page of this paperwork. If you have not heard from [place] regarding the results in 2 weeks, please contact this office.         Signed, Merri Ray, MD [facility]

## 2020-03-19 NOTE — Patient Instructions (Addendum)
Good talking with you today.  Continue same dose of cholesterol medication for now, but I will let you know if there are any concerns on your blood work.  Keep up the good work with low intensity exercise and watching your diet.  Keep follow-up with your dermatologist.  I will order the Cologuard colon cancer screening test that will be delivered to your home.   Pneumonia and flu vaccines given today.  If your shoulder starts to become more symptomatic, or new symptoms I am happy to see you and we can discuss other treatment options.  Let me know if there are questions and take care.    If you have lab work done today you will be contacted with your lab results within the next 2 weeks.  If you have not heard from [place] then please contact us. The fastest way to get your results is to register for My Chart.   IF you received an x-ray today, you will receive an invoice from [facility]. Please contact [facility] at [phone] with questions or concerns regarding your invoice.   IF you received labwork today, you will receive an invoice from [facility]. Please contact [facility] at [phone] with questions or concerns regarding your invoice.   Our billing staff will not be able to assist you with questions regarding bills from these companies.  You will be contacted with the lab results as soon as they are available. The fastest way to get your results is to activate your My Chart account. Instructions are located on the last page of this paperwork. If you have not heard from [place] regarding the results in 2 weeks, please contact this office.

## 2020-03-20 ENCOUNTER — Encounter: Payer: Self-pay | Admitting: Family Medicine

## 2020-03-20 LAB — COMPREHENSIVE METABOLIC PANEL
ALT: 32 IU/L (ref 0–44)
AST: 24 IU/L (ref 0–40)
Albumin/Globulin Ratio: 2 (ref 1.2–2.2)
Albumin: 4.7 g/dL (ref 3.8–4.8)
Alkaline Phosphatase: 56 IU/L (ref 44–121)
BUN/Creatinine Ratio: 14 (ref 10–24)
BUN: 14 mg/dL (ref 8–27)
Bilirubin Total: 0.4 mg/dL (ref 0.0–1.2)
CO2: 26 mmol/L (ref 20–29)
Calcium: 10 mg/dL (ref 8.6–10.2)
Chloride: 101 mmol/L (ref 96–106)
Creatinine, Ser: 1 mg/dL (ref 0.76–1.27)
GFR calc Af Amer: 91 mL/min/{1.73_m2} (ref >59)
GFR calc non Af Amer: 79 mL/min/{1.73_m2} (ref >59)
Globulin, Total: 2.3 g/dL (ref 1.5–4.5)
Glucose: 88 mg/dL (ref 65–99)
Potassium: 5.3 mmol/L — ABNORMAL HIGH (ref 3.5–5.2)
Sodium: 140 mmol/L (ref 134–144)
Total Protein: 7 g/dL (ref 6.0–8.5)

## 2020-03-20 LAB — PSA: Prostate Specific Ag, Serum: 1.3 ng/mL (ref 0.0–4.0)

## 2020-03-20 LAB — LIPID PANEL
Chol/HDL Ratio: 3.5 ratio (ref 0.0–5.0)
Cholesterol, Total: 142 mg/dL (ref 100–199)
HDL: 41 mg/dL (ref >39)
LDL Chol Calc (NIH): 71 mg/dL (ref 0–99)
Triglycerides: 174 mg/dL — ABNORMAL HIGH (ref 0–149)
VLDL Cholesterol Cal: 30 mg/dL (ref 5–40)

## 2020-03-28 ENCOUNTER — Other Ambulatory Visit: Payer: Self-pay | Admitting: Family Medicine

## 2020-03-28 DIAGNOSIS — E875 Hyperkalemia: Secondary | ICD-10-CM

## 2020-04-02 ENCOUNTER — Other Ambulatory Visit: Payer: Self-pay

## 2020-04-02 ENCOUNTER — Ambulatory Visit: Payer: Medicare HMO

## 2020-04-02 DIAGNOSIS — E875 Hyperkalemia: Secondary | ICD-10-CM | POA: Diagnosis not present

## 2020-04-02 LAB — BASIC METABOLIC PANEL
BUN/Creatinine Ratio: 13 (ref 10–24)
BUN: 13 mg/dL (ref 8–27)
CO2: 22 mmol/L (ref 20–29)
Calcium: 9.6 mg/dL (ref 8.6–10.2)
Chloride: 103 mmol/L (ref 96–106)
Creatinine, Ser: 0.98 mg/dL (ref 0.76–1.27)
GFR calc Af Amer: 93 mL/min/{1.73_m2} (ref >59)
GFR calc non Af Amer: 81 mL/min/{1.73_m2} (ref >59)
Glucose: 85 mg/dL (ref 65–99)
Potassium: 4.4 mmol/L (ref 3.5–5.2)
Sodium: 141 mmol/L (ref 134–144)

## 2020-06-28 DIAGNOSIS — Z961 Presence of intraocular lens: Secondary | ICD-10-CM | POA: Diagnosis not present

## 2020-06-28 DIAGNOSIS — H52223 Regular astigmatism, bilateral: Secondary | ICD-10-CM | POA: Diagnosis not present

## 2020-06-28 DIAGNOSIS — H524 Presbyopia: Secondary | ICD-10-CM | POA: Diagnosis not present

## 2020-06-28 DIAGNOSIS — H2513 Age-related nuclear cataract, bilateral: Secondary | ICD-10-CM | POA: Diagnosis not present

## 2020-06-28 DIAGNOSIS — H5203 Hypermetropia, bilateral: Secondary | ICD-10-CM | POA: Diagnosis not present

## 2020-08-05 DIAGNOSIS — H5213 Myopia, bilateral: Secondary | ICD-10-CM | POA: Diagnosis not present

## 2020-08-31 DIAGNOSIS — R69 Illness, unspecified: Secondary | ICD-10-CM | POA: Diagnosis not present

## 2020-08-31 DIAGNOSIS — Z85828 Personal history of other malignant neoplasm of skin: Secondary | ICD-10-CM | POA: Diagnosis not present

## 2020-08-31 DIAGNOSIS — R030 Elevated blood-pressure reading, without diagnosis of hypertension: Secondary | ICD-10-CM | POA: Diagnosis not present

## 2020-08-31 DIAGNOSIS — Z8249 Family history of ischemic heart disease and other diseases of the circulatory system: Secondary | ICD-10-CM | POA: Diagnosis not present

## 2020-08-31 DIAGNOSIS — E785 Hyperlipidemia, unspecified: Secondary | ICD-10-CM | POA: Diagnosis not present

## 2020-08-31 DIAGNOSIS — Z833 Family history of diabetes mellitus: Secondary | ICD-10-CM | POA: Diagnosis not present

## 2020-09-17 ENCOUNTER — Encounter: Payer: Self-pay | Admitting: Family Medicine

## 2020-09-17 ENCOUNTER — Ambulatory Visit (INDEPENDENT_AMBULATORY_CARE_PROVIDER_SITE_OTHER): Payer: Medicare HMO | Admitting: Family Medicine

## 2020-09-17 ENCOUNTER — Other Ambulatory Visit: Payer: Self-pay

## 2020-09-17 VITALS — BP 120/76 | HR 58 | Temp 98.2°F | Resp 17 | Ht 71.0 in | Wt 167.2 lb

## 2020-09-17 DIAGNOSIS — Z0000 Encounter for general adult medical examination without abnormal findings: Secondary | ICD-10-CM | POA: Diagnosis not present

## 2020-09-17 DIAGNOSIS — Z130 Encounter for screening for diseases of the blood and blood-forming organs and certain disorders involving the immune mechanism: Secondary | ICD-10-CM

## 2020-09-17 DIAGNOSIS — E781 Pure hyperglyceridemia: Secondary | ICD-10-CM | POA: Insufficient documentation

## 2020-09-17 DIAGNOSIS — Z1211 Encounter for screening for malignant neoplasm of colon: Secondary | ICD-10-CM | POA: Diagnosis not present

## 2020-09-17 DIAGNOSIS — Z131 Encounter for screening for diabetes mellitus: Secondary | ICD-10-CM

## 2020-09-17 DIAGNOSIS — E785 Hyperlipidemia, unspecified: Secondary | ICD-10-CM | POA: Diagnosis not present

## 2020-09-17 LAB — LIPID PANEL
Cholesterol: 131 mg/dL (ref 0–200)
HDL: 39.7 mg/dL (ref >39.00)
LDL Cholesterol: 52 mg/dL (ref 0–99)
NonHDL: 90.82
Total CHOL/HDL Ratio: 3
Triglycerides: 192 mg/dL — ABNORMAL HIGH (ref 0.0–149.0)
VLDL: 38.4 mg/dL (ref 0.0–40.0)

## 2020-09-17 LAB — COMPREHENSIVE METABOLIC PANEL
ALT: 21 U/L (ref 0–53)
AST: 17 U/L (ref 0–37)
Albumin: 4.6 g/dL (ref 3.5–5.2)
Alkaline Phosphatase: 41 U/L (ref 39–117)
BUN: 10 mg/dL (ref 6–23)
CO2: 31 mEq/L (ref 19–32)
Calcium: 9.6 mg/dL (ref 8.4–10.5)
Chloride: 102 mEq/L (ref 96–112)
Creatinine, Ser: 0.94 mg/dL (ref 0.40–1.50)
GFR: 84.76 mL/min (ref >60.00)
Glucose, Bld: 88 mg/dL (ref 70–99)
Potassium: 4.4 mEq/L (ref 3.5–5.1)
Sodium: 138 mEq/L (ref 135–145)
Total Bilirubin: 0.5 mg/dL (ref 0.2–1.2)
Total Protein: 6.7 g/dL (ref 6.0–8.3)

## 2020-09-17 LAB — HEMOGLOBIN A1C: Hgb A1c MFr Bld: 5.4 % (ref 4.6–6.5)

## 2020-09-17 MED ORDER — ATORVASTATIN CALCIUM 10 MG PO TABS: 10.0000 mg | ORAL_TABLET | Freq: Every day | ORAL | 3 refills | Status: DC

## 2020-09-17 NOTE — Patient Instructions (Addendum)
Thanks for coming in today. Let me know if you have any questions on the advanced directive paperwork. No med changes unless concerns on your bloodwork.  Preventive Care 12 Years and Older, Male Preventive care refers to lifestyle choices and visits with your health care provider that can promote health and wellness. This includes: A yearly physical exam. This is also called an annual wellness visit. Regular dental and eye exams. Immunizations. Screening for certain conditions. Healthy lifestyle choices, such as: Eating a healthy diet. Getting regular exercise. Not using drugs or products that contain nicotine and tobacco. Limiting alcohol use. What can I expect for my preventive care visit? Physical exam Your health care provider will check your: Height and weight. These may be used to calculate your BMI (body mass index). BMI is a measurement that tells if you are at a healthy weight. Heart rate and blood pressure. Body temperature. Skin for abnormal spots. Counseling Your health care provider may ask you questions about your: Past medical problems. Family's medical history. Alcohol, tobacco, and drug use. Emotional well-being. Home life and relationship well-being. Sexual activity. Diet, exercise, and sleep habits. History of falls. Memory and ability to understand (cognition). Work and work Statistician. Access to firearms. What immunizations do I need?  Vaccines are usually given at various ages, according to a schedule. Your health care provider will recommend vaccines for you based on your age, medicalhistory, and lifestyle or other factors, such as travel or where you work. What tests do I need? Blood tests Lipid and cholesterol levels. These may be checked every 5 years, or more often depending on your overall health. Hepatitis C test. Hepatitis B test. Screening Lung cancer screening. You may have this screening every year starting at age 37 if you have a  30-pack-year history of smoking and currently smoke or have quit within the past 15 years. Colorectal cancer screening. All adults should have this screening starting at age 95 and continuing until age 35. Your health care provider may recommend screening at age 72 if you are at increased risk. You will have tests every 1-10 years, depending on your results and the type of screening test. Prostate cancer screening. Recommendations will vary depending on your family history and other risks. Genital exam to check for testicular cancer or hernias. Diabetes screening. This is done by checking your blood sugar (glucose) after you have not eaten for a while (fasting). You may have this done every 1-3 years. Abdominal aortic aneurysm (AAA) screening. You may need this if you are a current or former smoker. STD (sexually transmitted disease) testing, if you are at risk. Follow these instructions at home: Eating and drinking  Eat a diet that includes fresh fruits and vegetables, whole grains, lean protein, and low-fat dairy products. Limit your intake of foods with high amounts of sugar, saturated fats, and salt. Take vitamin and mineral supplements as recommended by your health care provider. Do not drink alcohol if your health care provider tells you not to drink. If you drink alcohol: Limit how much you have to 0-2 drinks a day. Be aware of how much alcohol is in your drink. In the [place] one drink equals one 12 oz bottle of beer (355 mL), one 5 oz glass of wine (148 mL), or one 1 oz glass of hard liquor (44 mL).  Lifestyle Take daily care of your teeth and gums. Brush your teeth every morning and night with fluoride toothpaste. Floss one time each day. Stay active. Exercise for  at least 30 minutes 5 or more days each week. Do not use any products that contain nicotine or tobacco, such as cigarettes, e-cigarettes, and chewing tobacco. If you need help quitting, ask your health care provider. Do  not use drugs. If you are sexually active, practice safe sex. Use a condom or other form of protection to prevent STIs (sexually transmitted infections). Talk with your health care provider about taking a low-dose aspirin or statin. Find healthy ways to cope with stress, such as: Meditation, yoga, or listening to music. Journaling. Talking to a trusted person. Spending time with friends and family. Safety Always wear your seat belt while driving or riding in a vehicle. Do not drive: If you have been drinking alcohol. Do not ride with someone who has been drinking. When you are tired or distracted. While texting. Wear a helmet and other protective equipment during sports activities. If you have firearms in your house, make sure you follow all gun safety procedures. What's next? Visit your health care provider once a year for an annual wellness visit. Ask your health care provider how often you should have your eyes and teeth checked. Stay up to date on all vaccines. This information is not intended to replace advice given to you by your health care provider. Make sure you discuss any questions you have with your healthcare provider. Document Revised: 10/29/2018 Document Reviewed: 01/24/2018 Elsevier Patient Education  2022 [employer].

## 2020-09-17 NOTE — Progress Notes (Signed)
Subjective:  Patient ID: David [name], male    DOB: 25-May-1955  Age: 65 y.o. MRN: [id number]  CC:  Chief Complaint  Patient presents with   Medicare Wellness    Pt here today for annual exam, pt doing well no concerns     HPI David [name] presents for   Presents for annual wellness exam.   Care team: PCP: me Dermatology, Dr. Denna Haggard, history of basal cell carcinoma Ophthalmology Dr. Talbert Forest, history of cataracts  Hyperlipidemia: Lipitor 10 mg daily, no new side effects. Rare cramp in cold weather.  Lab Results  Component Value Date   CHOL 142 03/19/2020   HDL 41 03/19/2020   LDLCALC 71 03/19/2020   TRIG 174 (H) 03/19/2020   CHOLHDL 3.5 03/19/2020   Lab Results  Component Value Date   ALT 32 03/19/2020   AST 24 03/19/2020   ALKPHOS 56 03/19/2020   BILITOT 0.4 03/19/2020    Fall screening Fall Risk  09/17/2020 03/19/2020 03/19/2020 04/22/2019 01/12/2018  Falls in the past year? 0 0 0 0 0  Number falls in past yr: 0 - - 0 -  Injury with Fall? 0 - - 0 -  Risk for fall due to : No Fall Risks - - - -  Follow up Falls evaluation completed Falls evaluation completed Falls evaluation completed Falls evaluation completed -   Lighting in home: adequate Loose rugs/carpets/pets: 1 rug under chair. Cat at home. Working in garden at times.  Stairs:none.  Grab bars in bathroom: some in shower  Depression Screening: Depression screen [facility] 2/9 03/19/2020 03/19/2020 04/22/2019 01/12/2018  Decreased Interest 0 0 0 0  Down, Depressed, Hopeless 0 0 0 0  PHQ - 2 Score 0 0 0 0    Cancer Screening: Colon: ordered cologuard in February. Never arrived - reordered today.  Lab Results  Component Value Date   PSA1 1.3 03/19/2020   PSA1 1.3 04/22/2019    Immunization History  Administered Date(s) Administered   Fluad Quad(high Dose 65+) 03/19/2020   Moderna Sars-Covid-2 Vaccination 09/10/2019, 10/09/2019, 03/12/2020   Pneumococcal Polysaccharide-23 03/19/2020  Covid booster: recommended  2nd booster.  Shingles vaccine - recommended at pharmacy.  Functional Status Survey: Is the patient deaf or have difficulty hearing?: No Does the patient have difficulty seeing, even when wearing glasses/contacts?: No Does the patient have difficulty concentrating, remembering, or making decisions?: No Does the patient have difficulty walking or climbing stairs?: No Does the patient have difficulty dressing or bathing?: No Does the patient have difficulty doing errands alone such as visiting a doctor's office or shopping?: No  Memory Screen: 6CIT Screen 09/17/2020  What Year? 0 points  What month? 0 points  What time? 0 points  Count back from 20 0 points  Months in reverse 0 points  Repeat phrase 0 points  Total Score 0    Alcohol Screening: [place] Visit from 09/17/2020 in [place]  AUDIT-C Score 0       Tobacco:  none  No results found. Optho/optometry:  Dental: Every 17month.   Exercise: Works outdoors, fishing, riding bike for exercise usually 3 times per week. 3-4 miles. Usually over 1568m per week.   Advanced Directives:  Paperwork given.    History There are no problems to display for this patient.  Past Medical History:  Diagnosis Date   Cataract    Phreesia 03/16/2020   Nodular basal cell carcinoma (BCC) 03/24/2019   Mid Paraspinal (Cx35FU)   Nodular  basal cell carcinoma (BCC) 03/24/2019   Right Tragus(MOHS)   Past Surgical History:  Procedure Laterality Date   EYE SURGERY N/A    [employer]reesia 03/16/2020   No Known Allergies Prior to Admission medications   Medication Sig Start Date End Date Taking? Authorizing Provider  atorvastatin (LIPITOR) 10 MG tablet Take 1 tablet (10 mg total) by mouth daily. 03/19/20   [name], MD  Cholecalciferol (VITAMIN D) 50 MCG (2000 UT) tablet Take 2,000 Units by mouth daily.    [provider]  Multiple Vitamin (MULTIVITAMIN) tablet Take 1  tablet by mouth daily.    [provider]   Social History   Socioeconomic History   Marital status: Married    Spouse name: Not on file   Number of children: Not on file   Years of education: Not on file   Highest education level: Not on file  Occupational History   Not on file  Tobacco Use   Smoking status: Never   Smokeless tobacco: Never  Substance and Sexual Activity   Alcohol use: Never   Drug use: Never   Sexual activity: Yes  Other Topics Concern   Not on file  Social History Narrative   Not on file   Social Determinants of Health   Financial Resource Strain: Not on file  Food Insecurity: Not on file  Transportation Needs: Not on file  Physical Activity: Not on file  Stress: Not on file  Social Connections: Not on file  Intimate Partner Violence: Not on file    Review of Systems 13 point review of systems per patient health survey noted.  Negative other than as indicated above or in HPI.    Objective:   Vitals:   09/17/20 0855  BP: 120/76  Pulse: (!) 58  Resp: 17  Temp: 98.2 F (36.8 C)  TempSrc: Temporal  SpO2: 98%  Weight: 167 lb 3.2 oz (75.8 kg)  Height: '5\' 11"'$  (1.803 m)    Physical Exam Vitals reviewed.  Constitutional:      Appearance: He is well-developed.  HENT:     Head: Normocephalic and atraumatic.     Right Ear: External ear normal.     Left Ear: External ear normal.  Eyes:     Conjunctiva/sclera: Conjunctivae normal.     Pupils: Pupils are equal, round, and reactive to light.  Neck:     Thyroid: No thyromegaly.  Cardiovascular:     Rate and Rhythm: Normal rate and regular rhythm.     Heart sounds: Normal heart sounds.  Pulmonary:     Effort: Pulmonary effort is normal. No respiratory distress.     Breath sounds: Normal breath sounds. No wheezing.  Abdominal:     General: There is no distension.     Palpations: Abdomen is soft.     Tenderness: There is no abdominal tenderness.  Musculoskeletal:        General:  No tenderness. Normal range of motion.     Cervical back: Normal range of motion and neck supple.  Lymphadenopathy:     Cervical: No cervical adenopathy.  Skin:    General: Skin is warm and dry.  Neurological:     Mental Status: He is alert and oriented to person, place, and time.     Deep Tendon Reflexes: Reflexes are normal and symmetric.  Psychiatric:        Mood and Affect: Mood normal.        Behavior: Behavior normal.    Assessment &  Plan:  David [name] is a 65 y.o. male . Wellness examination - Plan: Comprehensive metabolic panel, Lipid panel, Hemoglobin A1c  - - anticipatory guidance as below in AVS, screening labs if needed. Health maintenance items as above in HPI discussed/recommended as applicable.  - no concerning responses on depression, fall, or functional status screening. Any positive responses noted as above. Advanced directives discussed as in CHL.   Hyperlipidemia, unspecified hyperlipidemia type - Plan: Comprehensive metabolic panel, Lipid panel, atorvastatin (LIPITOR) 10 MG tablet -  Stable, tolerating current regimen. Medications refilled. Labs pending as above.   Screen for colon cancer - Plan: Cologuard  Screening for diabetes mellitus - Plan: Comprehensive metabolic panel, Hemoglobin A1c   Meds ordered this encounter  Medications   atorvastatin (LIPITOR) 10 MG tablet    Sig: Take 1 tablet (10 mg total) by mouth daily.    Dispense:  90 tablet    Refill:  3    Patient Instructions  Thanks for coming in today. Let me know if you have any questions on the advanced directive paperwork. No med changes unless concerns on your bloodwork.  Preventive Care 84 Years and Older, Male Preventive care refers to lifestyle choices and visits with your health care provider that can promote health and wellness. This includes: A yearly physical exam. This is also called an annual wellness visit. Regular dental and eye exams. Immunizations. Screening for certain  conditions. Healthy lifestyle choices, such as: Eating a healthy diet. Getting regular exercise. Not using drugs or products that contain nicotine and tobacco. Limiting alcohol use. What can I expect for my preventive care visit? Physical exam Your health care provider will check your: Height and weight. These may be used to calculate your BMI (body mass index). BMI is a measurement that tells if you are at a healthy weight. Heart rate and blood pressure. Body temperature. Skin for abnormal spots. Counseling Your health care provider may ask you questions about your: Past medical problems. Family's medical history. Alcohol, tobacco, and drug use. Emotional well-being. Home life and relationship well-being. Sexual activity. Diet, exercise, and sleep habits. History of falls. Memory and ability to understand (cognition). Work and work Statistician. Access to firearms. What immunizations do I need?  Vaccines are usually given at various ages, according to a schedule. Your health care provider will recommend vaccines for you based on your age, medicalhistory, and lifestyle or other factors, such as travel or where you work. What tests do I need? Blood tests Lipid and cholesterol levels. These may be checked every 5 years, or more often depending on your overall health. Hepatitis C test. Hepatitis B test. Screening Lung cancer screening. You may have this screening every year starting at age 25 if you have a 30-pack-year history of smoking and currently smoke or have quit within the past 15 years. Colorectal cancer screening. All adults should have this screening starting at age 75 and continuing until age 79. Your health care provider may recommend screening at age 28 if you are at increased risk. You will have tests every 1-10 years, depending on your results and the type of screening test. Prostate cancer screening. Recommendations will vary depending on your family history and  other risks. Genital exam to check for testicular cancer or hernias. Diabetes screening. This is done by checking your blood sugar (glucose) after you have not eaten for a while (fasting). You may have this done every 1-3 years. Abdominal aortic aneurysm (AAA) screening. You may need this if  you are a current or former smoker. STD (sexually transmitted disease) testing, if you are at risk. Follow these instructions at home: Eating and drinking  Eat a diet that includes fresh fruits and vegetables, whole grains, lean protein, and low-fat dairy products. Limit your intake of foods with high amounts of sugar, saturated fats, and salt. Take vitamin and mineral supplements as recommended by your health care provider. Do not drink alcohol if your health care provider tells you not to drink. If you drink alcohol: Limit how much you have to 0-2 drinks a day. Be aware of how much alcohol is in your drink. In the [place] one drink equals one 12 oz bottle of beer (355 mL), one 5 oz glass of wine (148 mL), or one 1 oz glass of hard liquor (44 mL).  Lifestyle Take daily care of your teeth and gums. Brush your teeth every morning and night with fluoride toothpaste. Floss one time each day. Stay active. Exercise for at least 30 minutes 5 or more days each week. Do not use any products that contain nicotine or tobacco, such as cigarettes, e-cigarettes, and chewing tobacco. If you need help quitting, ask your health care provider. Do not use drugs. If you are sexually active, practice safe sex. Use a condom or other form of protection to prevent STIs (sexually transmitted infections). Talk with your health care provider about taking a low-dose aspirin or statin. Find healthy ways to cope with stress, such as: Meditation, yoga, or listening to music. Journaling. Talking to a trusted person. Spending time with friends and family. Safety Always wear your seat belt while driving or riding in a vehicle. Do  not drive: If you have been drinking alcohol. Do not ride with someone who has been drinking. When you are tired or distracted. While texting. Wear a helmet and other protective equipment during sports activities. If you have firearms in your house, make sure you follow all gun safety procedures. What's next? Visit your health care provider once a year for an annual wellness visit. Ask your health care provider how often you should have your eyes and teeth checked. Stay up to date on all vaccines. This information is not intended to replace advice given to you by your health care provider. Make sure you discuss any questions you have with your healthcare provider. Document Revised: 10/29/2018 Document Reviewed: 01/24/2018 Elsevier Patient Education  2022 [place],   Merri Ray, MD [place], [facility] 09/17/20 10:02 AM

## 2020-09-23 ENCOUNTER — Telehealth: Payer: Self-pay | Admitting: Family Medicine

## 2020-09-23 ENCOUNTER — Other Ambulatory Visit: Payer: Medicare HMO

## 2020-09-23 DIAGNOSIS — E785 Hyperlipidemia, unspecified: Secondary | ICD-10-CM

## 2020-09-24 NOTE — Telephone Encounter (Signed)
Medication sent to [facility] 09/17/20 with refills

## 2020-09-24 NOTE — Telephone Encounter (Signed)
Patient wants his atorvastatin called in to [facility] on [address] - He just had his physical last Friday.

## 2020-09-28 LAB — COLOGUARD

## 2020-09-29 ENCOUNTER — Other Ambulatory Visit: Payer: Self-pay

## 2020-09-29 DIAGNOSIS — E785 Hyperlipidemia, unspecified: Secondary | ICD-10-CM

## 2020-09-29 MED ORDER — ATORVASTATIN CALCIUM 10 MG PO TABS: 10.0000 mg | ORAL_TABLET | Freq: Every day | ORAL | 0 refills | Status: DC

## 2020-09-29 NOTE — Telephone Encounter (Signed)
Medication resent to preferred pharmacy.

## 2020-09-29 NOTE — Telephone Encounter (Signed)
Pt states that he can't use [facility] any longer due to insurance, he wanted to know if this could be sent into [facility] on [address]   He is out of the medication. He never picked up from [facility] due to the out of pocket cost.

## 2020-10-07 DIAGNOSIS — Z1211 Encounter for screening for malignant neoplasm of colon: Secondary | ICD-10-CM | POA: Diagnosis not present

## 2020-10-14 LAB — COLOGUARD: Cologuard: NEGATIVE

## 2020-10-27 ENCOUNTER — Encounter: Payer: Self-pay | Admitting: Dermatology

## 2020-10-27 ENCOUNTER — Other Ambulatory Visit: Payer: Self-pay

## 2020-10-27 ENCOUNTER — Ambulatory Visit: Payer: Medicare HMO | Admitting: Dermatology

## 2020-10-27 DIAGNOSIS — D1800 Hemangioma unspecified site: Secondary | ICD-10-CM | POA: Diagnosis not present

## 2020-10-27 DIAGNOSIS — L821 Other seborrheic keratosis: Secondary | ICD-10-CM

## 2020-10-27 DIAGNOSIS — Z1283 Encounter for screening for malignant neoplasm of skin: Secondary | ICD-10-CM | POA: Diagnosis not present

## 2020-10-27 DIAGNOSIS — Z85828 Personal history of other malignant neoplasm of skin: Secondary | ICD-10-CM | POA: Diagnosis not present

## 2020-11-07 ENCOUNTER — Encounter: Payer: Self-pay | Admitting: Dermatology

## 2020-11-07 NOTE — Progress Notes (Signed)
   Follow-Up Visit   Subjective  David [name] is a 65 y.o. male who presents for the following: Annual Exam (No new concerns).  General skin examination, history of nonmelanoma Location:  Duration:  Quality:  Associated Signs/Symptoms: Modifying Factors:  Severity:  Timing: Context:   Objective  Well appearing patient in no apparent distress; mood and affect are within normal limits. Torso - Posterior (Back) Full body skin exam no atypical pigmented lesions or nonmelanoma skin cancer  Mid Parietal Scalp Smooth red 3 mm dermal papule  Left Lower Leg - Anterior Textured light brown flat papule  Mid Back No sign recurrence    A full examination was performed including scalp, head, eyes, ears, nose, lips, neck, chest, axillae, abdomen, back, buttocks, bilateral upper extremities, bilateral lower extremities, hands, feet, fingers, toes, fingernails, and toenails. All findings within normal limits unless otherwise noted below.   Assessment & Plan    Encounter for screening for malignant neoplasm of skin Torso - Posterior (Back)  Annual skin examination, encouraged to self examine twice annually.  Continue ultraviolet protection.  Hemangioma, unspecified site Mid Parietal Scalp  No intervention necessary  Seborrheic keratosis Left Lower Leg - Anterior  Leave if stable  Personal history of skin cancer Mid Back  Recheck as needed change      I, [name], MD, have reviewed all documentation for this visit.  The documentation on 11/07/20 for the exam, diagnosis, procedures, and orders are all accurate and complete.

## 2020-11-10 ENCOUNTER — Other Ambulatory Visit: Payer: Self-pay

## 2020-11-10 ENCOUNTER — Encounter: Payer: Self-pay | Admitting: Family Medicine

## 2020-11-10 ENCOUNTER — Ambulatory Visit (INDEPENDENT_AMBULATORY_CARE_PROVIDER_SITE_OTHER): Payer: Medicare HMO | Admitting: Family Medicine

## 2020-11-10 VITALS — BP 118/86 | HR 67 | Ht 71.0 in | Wt 171.0 lb

## 2020-11-10 DIAGNOSIS — Z0000 Encounter for general adult medical examination without abnormal findings: Secondary | ICD-10-CM

## 2020-11-10 DIAGNOSIS — Z1329 Encounter for screening for other suspected endocrine disorder: Secondary | ICD-10-CM | POA: Diagnosis not present

## 2020-11-10 DIAGNOSIS — Z131 Encounter for screening for diabetes mellitus: Secondary | ICD-10-CM

## 2020-11-10 DIAGNOSIS — R001 Bradycardia, unspecified: Secondary | ICD-10-CM | POA: Diagnosis not present

## 2020-11-10 DIAGNOSIS — Z130 Encounter for screening for diseases of the blood and blood-forming organs and certain disorders involving the immune mechanism: Secondary | ICD-10-CM

## 2020-11-10 DIAGNOSIS — R002 Palpitations: Secondary | ICD-10-CM

## 2020-11-10 DIAGNOSIS — E785 Hyperlipidemia, unspecified: Secondary | ICD-10-CM

## 2020-11-10 LAB — CBC
HCT: 46.1 % (ref 39.0–52.0)
Hemoglobin: 15.6 g/dL (ref 13.0–17.0)
MCHC: 33.8 g/dL (ref 30.0–36.0)
MCV: 93.4 fl (ref 78.0–100.0)
Platelets: 218 10*3/uL (ref 150.0–400.0)
RBC: 4.94 Mil/uL (ref 4.22–5.81)
RDW: 12.2 % (ref 11.5–15.5)
WBC: 5.4 10*3/uL (ref 4.0–10.5)

## 2020-11-10 LAB — TSH: TSH: 2.14 u[IU]/mL (ref 0.35–5.50)

## 2020-11-10 NOTE — Patient Instructions (Signed)
I will check some blood work as well as refer you to cardiology as they will likely place a Zio patch or a monitor to check to see what heart palpitations may be coming from.  If you have any new symptoms, be seen sooner as we discussed.  Make sure to continue drinking plenty of fluids.  Let me know if there are questions.   Palpitations Palpitations are feelings that your heartbeat is irregular or is faster than normal. It may feel like your heart is fluttering or skipping a beat. Palpitations are usually not a serious problem. They may be caused by many things, including smoking, caffeine, alcohol, stress, and certain medicines or drugs. Most causes of palpitations are not serious. However, some palpitations can be a sign of a serious problem. You may need further tests to rule out serious medical problems. Follow these instructions at home:   Pay attention to any changes in your condition. Take these actions to help manage your symptoms: Eating and drinking Avoid foods and drinks that may cause palpitations. These may include: Caffeinated coffee, tea, soft drinks, diet pills, and energy drinks. Chocolate. Alcohol. Lifestyle Take steps to reduce your stress and anxiety. Things that can help you relax include: Yoga. Mind-body activities, such as deep breathing, meditation, or using words and images to create positive thoughts (guided imagery). Physical activity, such as swimming, jogging, or walking. Tell your health care provider if your palpitations increase with activity. If you have chest pain or shortness of breath with activity, do not continue the activity until you are seen by your health care provider. Biofeedback. This is a method that helps you learn to use your mind to control things in your body, such as your heartbeat. Do not use drugs, including cocaine or ecstasy. Do not use marijuana. Get plenty of rest and sleep. Keep a regular bed time. General instructions Take  over-the-counter and prescription medicines only as told by your health care provider. Do not use any products that contain nicotine or tobacco, such as cigarettes and e-cigarettes. If you need help quitting, ask your health care provider. Keep all follow-up visits as told by your health care provider. This is important. These may include visits for further testing if palpitations do not go away or get worse. Contact a health care provider if you: Continue to have a fast or irregular heartbeat after 24 hours. Notice that your palpitations occur more often. Get help right away if you: Have chest pain or shortness of breath. Have a severe headache. Feel dizzy or you faint. Summary Palpitations are feelings that your heartbeat is irregular or is faster than normal. It may feel like your heart is fluttering or skipping a beat. Palpitations may be caused by many things, including smoking, caffeine, alcohol, stress, certain medicines, and drugs. Although most causes of palpitations are not serious, some causes can be a sign of a serious medical problem. Get help right away if you faint or have chest pain, shortness of breath, a severe headache, or dizziness. This information is not intended to replace advice given to you by your health care provider. Make sure you discuss any questions you have with your health care provider. Document Revised: 03/07/2017 Document Reviewed: 03/14/2017 Elsevier Patient Education  2022 [employer].

## 2020-11-10 NOTE — Progress Notes (Signed)
Subjective:  Patient ID: David [name], male    DOB: [birth year]  Age: 65 y.o. MRN: [id number]  CC:  Chief Complaint  Patient presents with   Palpitations    Pt stated--feeling heart is skipping after eating meal--1 month    HPI [name] presents for   Palpitations: Notices about 54min after eating. Symptoms 5 years ago, resolved after a month, no eval then.  Symptoms returned past month. More prominent. Has sometimes noted after coffee prior. Ate [place] yesterday, heart rate out of time - takes breath momentarily. Every few beats.  Daily lately. Few times per day at times.   Occasionally here this am. Not having symptoms during EKG today.  Constitutional: Negative for fatigue and unexpected weight change.  Eyes: Negative for visual disturbance.  Respiratory: Negative for cough, chest tightness and shortness of breath (only during heart skip beat only - fleeting symptoms).  Cardiovascular: Negative for chest pain, and leg swelling.  Gastrointestinal: Negative for abdominal pain and blood in stool.  Neurological: Negative for dizziness, light-headedness and headaches.  Caffeine - minimal - 1-2 cups coffee or tea. No alcohol or tobacco.  No symptoms with exercise - feels better cycling, then recurs after.  No heartburn.  Stays well-hydrated, denies new anxiety.  History Patient Active Problem List   Diagnosis Date Noted   HLD (hyperlipidemia) 09/17/2020   Past Medical History:  Diagnosis Date   Cataract    Phreesia 03/16/2020   Nodular basal cell carcinoma (BCC) 03/24/2019   Mid Paraspinal (Cx35FU)   Nodular basal cell carcinoma (BCC) 03/24/2019   Right Tragus(MOHS)   Past Surgical History:  Procedure Laterality Date   EYE SURGERY N/A    Phreesia 03/16/2020   No Known Allergies Prior to Admission medications   Medication Sig Start Date End Date Taking? Authorizing Provider  atorvastatin (LIPITOR) 10 MG tablet Take 1 tablet (10 mg total) by mouth  daily. 09/29/20  Yes [name], MD  Cholecalciferol (VITAMIN D) 50 MCG (2000 UT) tablet Take 2,000 Units by mouth daily.   Yes [provider]  Multiple Vitamin (MULTIVITAMIN) tablet Take 1 tablet by mouth daily.   Yes [provider]   Social History   Socioeconomic History   Marital status: Married    Spouse name: Not on file   Number of children: Not on file   Years of education: Not on file   Highest education level: Not on file  Occupational History   Not on file  Tobacco Use   Smoking status: Never   Smokeless tobacco: Never  Substance and Sexual Activity   Alcohol use: Never   Drug use: Never   Sexual activity: Yes  Other Topics Concern   Not on file  Social History Narrative   Not on file   Social Determinants of Health   Financial Resource Strain: Not on file  Food Insecurity: Not on file  Transportation Needs: Not on file  Physical Activity: Not on file  Stress: Not on file  Social Connections: Not on file  Intimate Partner Violence: Not on file    Review of Systems  Per HPI.  Objective:   Vitals:   11/10/20 0902  BP: 118/86  Pulse: 67  SpO2: 97%  Weight: 171 lb (77.6 kg)  Height: 5\' 11"  (1.803 m)     Physical Exam Vitals reviewed.  Constitutional:      Appearance: He is well-developed.  HENT:     Head: Normocephalic and atraumatic.  Neck:  Vascular: No carotid bruit or JVD.  Cardiovascular:     Rate and Rhythm: Normal rate and regular rhythm.     Heart sounds: Normal heart sounds. No murmur heard. Pulmonary:     Effort: Pulmonary effort is normal.     Breath sounds: Normal breath sounds. No rales.  Musculoskeletal:     Right lower leg: No edema.     Left lower leg: No edema.  Skin:    General: Skin is warm and dry.  Neurological:     Mental Status: He is alert and oriented to person, place, and time.  Psychiatric:        Mood and Affect: Mood normal.    EKG, sinus rhythm.  Rate 57.  Regular rhythm.  No  apparent ST, T wave changes or significant Q waves.   Assessment & Plan:  David [name] is a 65 y.o. male . Palpitations - Plan: EKG 12-Lead, CBC, TSH, Ambulatory referral to Cardiology, CANCELED: Ambulatory referral to Cardiology  Screening for deficiency anemia - Plan: CBC  Screening for thyroid disorder - Plan: TSH  Bradycardia - Plan: TSH, Ambulatory referral to Cardiology, CANCELED: Ambulatory referral to Cardiology  Palpitations at rest.  Bradycardia on baseline EKG in office.  No previous EKG available for review.  He does exercise regularly including cycling without symptoms.  Possible underlying arrhythmia.  Was not having symptoms during EKG today.  Suspect Zio patch or outpatient monitoring may be needed to capture his symptoms.  Will refer to cardiology, check CBC, TSH, ER/RTC precautions if more symptomatic.   No orders of the defined types were placed in this encounter.  Patient Instructions  I will check some blood work as well as refer you to cardiology as they will likely place a Zio patch or a monitor to check to see what heart palpitations may be coming from.  If you have any new symptoms, be seen sooner as we discussed.  Make sure to continue drinking plenty of fluids.  Let me know if there are questions.   Palpitations Palpitations are feelings that your heartbeat is irregular or is faster than normal. It may feel like your heart is fluttering or skipping a beat. Palpitations are usually not a serious problem. They may be caused by many things, including smoking, caffeine, alcohol, stress, and certain medicines or drugs. Most causes of palpitations are not serious. However, some palpitations can be a sign of a serious problem. You may need further tests to rule out serious medical problems. Follow these instructions at home:   Pay attention to any changes in your condition. Take these actions to help manage your symptoms: Eating and drinking Avoid foods and drinks  that may cause palpitations. These may include: Caffeinated coffee, tea, soft drinks, diet pills, and energy drinks. Chocolate. Alcohol. Lifestyle Take steps to reduce your stress and anxiety. Things that can help you relax include: Yoga. Mind-body activities, such as deep breathing, meditation, or using words and images to create positive thoughts (guided imagery). Physical activity, such as swimming, jogging, or walking. Tell your health care provider if your palpitations increase with activity. If you have chest pain or shortness of breath with activity, do not continue the activity until you are seen by your health care provider. Biofeedback. This is a method that helps you learn to use your mind to control things in your body, such as your heartbeat. Do not use drugs, including cocaine or ecstasy. Do not use marijuana. Get plenty of rest and sleep.  Keep a regular bed time. General instructions Take over-the-counter and prescription medicines only as told by your health care provider. Do not use any products that contain nicotine or tobacco, such as cigarettes and e-cigarettes. If you need help quitting, ask your health care provider. Keep all follow-up visits as told by your health care provider. This is important. These may include visits for further testing if palpitations do not go away or get worse. Contact a health care provider if you: Continue to have a fast or irregular heartbeat after 24 hours. Notice that your palpitations occur more often. Get help right away if you: Have chest pain or shortness of breath. Have a severe headache. Feel dizzy or you faint. Summary Palpitations are feelings that your heartbeat is irregular or is faster than normal. It may feel like your heart is fluttering or skipping a beat. Palpitations may be caused by many things, including smoking, caffeine, alcohol, stress, certain medicines, and drugs. Although most causes of palpitations are not  serious, some causes can be a sign of a serious medical problem. Get help right away if you faint or have chest pain, shortness of breath, a severe headache, or dizziness. This information is not intended to replace advice given to you by your health care provider. Make sure you discuss any questions you have with your health care provider. Document Revised: 03/07/2017 Document Reviewed: 03/14/2017 Elsevier Patient Education  2022 [place],   Merri Ray, MD [place], [facility] 11/10/20 9:55 AM

## 2020-11-23 DIAGNOSIS — R002 Palpitations: Secondary | ICD-10-CM | POA: Insufficient documentation

## 2020-11-23 NOTE — Progress Notes (Signed)
Patient referred by [name], MD for palpitations  Subjective:   David [name], male    DOB: [birth year], 65 y.o.   MRN: [id number]   Chief Complaint  Patient presents with   Palpitations   New Patient (Initial Visit)     HPI  65 y.o. Caucasian male with hypertension, irregular heartbeat  Patient is very active, bikes 5 to 7 miles regularly and also works outdoors.  He has been experiencing symptoms of palpitations that usually start about 20 minutes or so after eating.  Symptoms may last for few minutes up to several hours.  They are associated with shortness of breath, but no chest pain, presyncope or syncope.  Patient does not smoke, does not drink alcohol, drinks about 1 cup of coffee and 1 soda per day.   Past Medical History:  Diagnosis Date   Cataract    Phreesia 03/16/2020   Nodular basal cell carcinoma (BCC) 03/24/2019   Mid Paraspinal (Cx35FU)   Nodular basal cell carcinoma (BCC) 03/24/2019   Right Tragus(MOHS)     Past Surgical History:  Procedure Laterality Date   EYE SURGERY N/A    Phreesia 03/16/2020     Social History   Tobacco Use  Smoking Status Never  Smokeless Tobacco Never    Social History   Substance and Sexual Activity  Alcohol Use Never     Family History  Problem Relation Age of Onset   Hyperlipidemia Mother    Alzheimer's disease Mother    Diabetes Father    Hyperlipidemia Father      Current Outpatient Medications on File Prior to Visit  Medication Sig Dispense Refill   atorvastatin (LIPITOR) 10 MG tablet Take 1 tablet (10 mg total) by mouth daily. 90 tablet 0   Cholecalciferol (VITAMIN D) 50 MCG (2000 UT) tablet Take 2,000 Units by mouth daily.     Multiple Vitamin (MULTIVITAMIN) tablet Take 1 tablet by mouth daily.     No current facility-administered medications on file prior to visit.    Cardiovascular and other pertinent studies:  EKG 11/24/2020: Sinus rhythm 67 bpm Normal EKG   Recent  labs: 10/07/2020: Glucose 88, BUN/Cr 10/0.94. EGFR 84. Na/K 138/4.4. Rest of the CMP normal H/H 15/46. MCV 93. Platelets 218 HbA1C 5.4% Chol 131, TG 192, HDL 39, LDL 52 TSH 2.1 normal    Review of Systems  Cardiovascular:  Positive for palpitations. Negative for chest pain, dyspnea on exertion, leg swelling and syncope.        Vitals:   11/24/20 0843  BP: 107/73  Pulse: 67  Resp: 16  Temp: 98.2 F (36.8 C)  SpO2: 98%     Body mass index is 23.71 kg/m. Filed Weights   11/24/20 0843  Weight: 170 lb (77.1 kg)     Objective:   Physical Exam Vitals and nursing note reviewed.  Constitutional:      General: He is not in acute distress. Neck:     Vascular: No JVD.  Cardiovascular:     Rate and Rhythm: Normal rate and regular rhythm.     Pulses: Normal pulses.     Heart sounds: Normal heart sounds. No murmur heard. Pulmonary:     Effort: Pulmonary effort is normal.     Breath sounds: Normal breath sounds. No wheezing or rales.  Musculoskeletal:     Right lower leg: No edema.     Left lower leg: No edema.        Assessment & Recommendations:  65 y.o. Caucasian male with hypertension, irregular heartbeat  Palpitations, irregular heartbeat: Concerning for atrial fibrillation.  Normal resting EKG.  Recommend echocardiogram given bradycardia and irregular heartbeat, and 1 week cardiac telemetry.  Discussed vagal maneuvers.  Patient does mention that 1 episode did resolve with strong cough in the past.  Further recommendations after above testing    Thank you for referring the patient to [place]. Please feel free to contact with any questions.   [name], MD Pager: [phone] Office: [phone]

## 2020-11-24 ENCOUNTER — Encounter: Payer: Self-pay | Admitting: Cardiology

## 2020-11-24 ENCOUNTER — Ambulatory Visit: Payer: Medicare HMO | Admitting: Cardiology

## 2020-11-24 ENCOUNTER — Other Ambulatory Visit: Payer: Self-pay

## 2020-11-24 VITALS — BP 107/73 | HR 67 | Temp 98.2°F | Resp 16 | Ht 71.0 in | Wt 170.0 lb

## 2020-11-24 DIAGNOSIS — I499 Cardiac arrhythmia, unspecified: Secondary | ICD-10-CM | POA: Diagnosis not present

## 2020-11-24 DIAGNOSIS — R002 Palpitations: Secondary | ICD-10-CM

## 2020-12-02 ENCOUNTER — Inpatient Hospital Stay: Payer: Medicare HMO

## 2020-12-02 ENCOUNTER — Other Ambulatory Visit: Payer: Self-pay

## 2020-12-02 ENCOUNTER — Ambulatory Visit: Payer: Medicare HMO

## 2020-12-02 DIAGNOSIS — I10 Essential (primary) hypertension: Secondary | ICD-10-CM | POA: Diagnosis not present

## 2020-12-02 DIAGNOSIS — I499 Cardiac arrhythmia, unspecified: Secondary | ICD-10-CM | POA: Diagnosis not present

## 2020-12-02 DIAGNOSIS — R002 Palpitations: Secondary | ICD-10-CM | POA: Diagnosis not present

## 2020-12-03 DIAGNOSIS — I499 Cardiac arrhythmia, unspecified: Secondary | ICD-10-CM | POA: Diagnosis not present

## 2020-12-24 ENCOUNTER — Other Ambulatory Visit: Payer: Self-pay | Admitting: Family Medicine

## 2020-12-24 DIAGNOSIS — E785 Hyperlipidemia, unspecified: Secondary | ICD-10-CM

## 2020-12-24 DIAGNOSIS — I499 Cardiac arrhythmia, unspecified: Secondary | ICD-10-CM | POA: Diagnosis not present

## 2021-01-03 ENCOUNTER — Ambulatory Visit: Payer: Medicare HMO | Admitting: Cardiology

## 2021-01-03 ENCOUNTER — Encounter: Payer: Self-pay | Admitting: Cardiology

## 2021-01-03 ENCOUNTER — Other Ambulatory Visit: Payer: Self-pay

## 2021-01-03 VITALS — BP 126/77 | HR 67 | Temp 97.8°F | Resp 16 | Ht 71.0 in | Wt 174.0 lb

## 2021-01-03 DIAGNOSIS — I493 Ventricular premature depolarization: Secondary | ICD-10-CM

## 2021-01-03 MED ORDER — METOPROLOL TARTRATE 25 MG PO TABS: 25.0000 mg | ORAL_TABLET | Freq: Two times a day (BID) | ORAL | 3 refills | Status: DC

## 2021-01-03 NOTE — Progress Notes (Signed)
Patient referred by [name], MD for palpitations  Subjective:   David [name], male    DOB: 1955-11-06, 65 y.o.   MRN: [id number]   Chief Complaint  Patient presents with   Palpitations   Follow-up    monitor     HPI  65 y.o. Caucasian male with hypertension, irregular heartbeat  Reviewed recent test results with the patient, details below.  Initial consultation visit: Patient is very active, bikes 5 to 7 miles regularly and also works outdoors.  He has been experiencing symptoms of palpitations that usually start about 20 minutes or so after eating.  Symptoms may last for few minutes up to several hours.  They are associated with shortness of breath, but no chest pain, presyncope or syncope.  Patient does not smoke, does not drink alcohol, drinks about 1 cup of coffee and 1 soda per day.  Current Outpatient Medications on File Prior to Visit  Medication Sig Dispense Refill   atorvastatin (LIPITOR) 10 MG tablet TAKE 1 TABLET BY MOUTH EVERY DAY 90 tablet 0   Cholecalciferol (VITAMIN D) 50 MCG (2000 UT) tablet Take 2,000 Units by mouth daily.     Multiple Vitamin (MULTIVITAMIN) tablet Take 1 tablet by mouth daily.     No current facility-administered medications on file prior to visit.    Cardiovascular and other pertinent studies:  Mobile cardiac telemetry 7 days 12/02/2020 - 12/09/2020: Dominant rhythm: Sinus. HR 39-127 bpm. Avg HR 61 bpm. 1 episodes of probable atrial tachycardia at 103 bpm for 4 beats. <1% isolated SVE, couplets. 0 episodes of VT. 1.7% isolated VE, no couplet/triplets. No atrial fibrillation/atrial flutter/VT/high grade AV block, sinus pause >3sec noted. 43 patient triggered events, most correlated with ventricular ectopy.  Echocardiogram 12/02/2020:  Normal LV systolic function with visual EF 60-65%. Left ventricle cavity  is normal in size. Mild left ventricular hypertrophy. Normal global wall  motion. Normal diastolic filling  pattern, normal LAP.  Mild tricuspid regurgitation. No evidence of pulmonary hypertension.  No prior studies for comparison.   EKG 11/24/2020: Sinus rhythm 67 bpm Normal EKG   Recent labs: 10/07/2020: Glucose 88, BUN/Cr 10/0.94. EGFR 84. Na/K 138/4.4. Rest of the CMP normal H/H 15/46. MCV 93. Platelets 218 HbA1C 5.4% Chol 131, TG 192, HDL 39, LDL 52 TSH 2.1 normal    Review of Systems  Cardiovascular:  Positive for palpitations. Negative for chest pain, dyspnea on exertion, leg swelling and syncope.        Vitals:   01/03/21 1138  BP: 126/77  Pulse: 67  Resp: 16  Temp: 97.8 F (36.6 C)  SpO2: 97%     Body mass index is 24.27 kg/m. Filed Weights   01/03/21 1138  Weight: 174 lb (78.9 kg)     Objective:   Physical Exam Vitals and nursing note reviewed.  Constitutional:      General: He is not in acute distress. Neck:     Vascular: No JVD.  Cardiovascular:     Rate and Rhythm: Normal rate and regular rhythm.     Pulses: Normal pulses.     Heart sounds: Normal heart sounds. No murmur heard. Pulmonary:     Effort: Pulmonary effort is normal.     Breath sounds: Normal breath sounds. No wheezing or rales.  Musculoskeletal:     Right lower leg: No edema.     Left lower leg: No edema.        Assessment & Recommendations:    65 y.o.  Caucasian male with hypertension, symptomatic PVC  Symptomatic PVC: Low burden.  However, he is quite symptomatic from PVCs.  Recommend metoprolol tartrate 25 mg twice daily.  F/u in 3 months   [name], MD Pager: [phone] Office: [phone]

## 2021-03-21 ENCOUNTER — Other Ambulatory Visit: Payer: Self-pay | Admitting: Family Medicine

## 2021-03-21 DIAGNOSIS — E785 Hyperlipidemia, unspecified: Secondary | ICD-10-CM

## 2021-04-05 NOTE — Progress Notes (Signed)
Patient referred by [name], MD for palpitations  Subjective:   David [name], male    DOB: [birth year], 66 y.o.   MRN: [id number]   Chief Complaint  Patient presents with   premature ventricular contraction   Follow-up     HPI  66 y.o. Caucasian male with symptomatic PVC  He continues to have several episodes of "heart sinking feeling". Sometimes, it is associated with eating certain foods, other times it is not. He continues to stay active with regular bilking, without any chest pain or shortness of breath. He says he had similar episodes few years back, lasted for about 60 days, and then resolved on their own.   Initial consultation visit: Patient is very active, bikes 5 to 7 miles regularly and also works outdoors.  He has been experiencing symptoms of palpitations that usually start about 20 minutes or so after eating.  Symptoms may last for few minutes up to several hours.  They are associated with shortness of breath, but no chest pain, presyncope or syncope.  Patient does not smoke, does not drink alcohol, drinks about 1 cup of coffee and 1 soda per day.  Current Outpatient Medications on File Prior to Visit  Medication Sig Dispense Refill   atorvastatin (LIPITOR) 10 MG tablet TAKE 1 TABLET BY MOUTH EVERY DAY 90 tablet 0   Cholecalciferol (VITAMIN D) 50 MCG (2000 UT) tablet Take 2,000 Units by mouth daily.     metoprolol tartrate (LOPRESSOR) 25 MG tablet Take 1 tablet (25 mg total) by mouth 2 (two) times daily. 180 tablet 3   Multiple Vitamin (MULTIVITAMIN) tablet Take 1 tablet by mouth daily.     No current facility-administered medications on file prior to visit.    Cardiovascular and other pertinent studies:  EKG 04/06/2021: Sinus bradycardia 50 bpm Otherwise normal EKG  Mobile cardiac telemetry 7 days 12/02/2020 - 12/09/2020: Dominant rhythm: Sinus. HR 39-127 bpm. Avg HR 61 bpm. 1 episodes of probable atrial tachycardia at 103 bpm for 4 beats. <1%  isolated SVE, couplets. 0 episodes of VT. 1.7% isolated VE, no couplet/triplets. No atrial fibrillation/atrial flutter/VT/high grade AV block, sinus pause >3sec noted. 43 patient triggered events, most correlated with ventricular ectopy.  Echocardiogram 12/02/2020:  Normal LV systolic function with visual EF 60-65%. Left ventricle cavity  is normal in size. Mild left ventricular hypertrophy. Normal global wall  motion. Normal diastolic filling pattern, normal LAP.  Mild tricuspid regurgitation. No evidence of pulmonary hypertension.  No prior studies for comparison.   EKG 11/24/2020: Sinus rhythm 67 bpm Normal EKG   Recent labs: 10/07/2020: Glucose 88, BUN/Cr 10/0.94. EGFR 84. Na/K 138/4.4. Rest of the CMP normal H/H 15/46. MCV 93. Platelets 218 HbA1C 5.4% Chol 131, TG 192, HDL 39, LDL 52 TSH 2.1 normal    Review of Systems  Cardiovascular:  Positive for palpitations. Negative for chest pain, dyspnea on exertion, leg swelling and syncope.        Vitals:   04/06/21 0829  BP: 108/73  Pulse: (!) 50  Temp: 98.2 F (36.8 C)  SpO2: 96%    Body mass index is 24.55 kg/m. Filed Weights   04/06/21 0829  Weight: 176 lb (79.8 kg)     Objective:   Physical Exam Vitals and nursing note reviewed.  Constitutional:      General: He is not in acute distress. Neck:     Vascular: No JVD.  Cardiovascular:     Rate and Rhythm: Normal rate and regular  rhythm.     Pulses: Normal pulses.     Heart sounds: Normal heart sounds. No murmur heard. Pulmonary:     Effort: Pulmonary effort is normal.     Breath sounds: Normal breath sounds. No wheezing or rales.  Musculoskeletal:     Right lower leg: No edema.     Left lower leg: No edema.        Assessment & Recommendations:    66 y.o. Caucasian male with symptomatic PVC  Symptomatic PVC: Low burden.  However, he remains quite symptomatic from PVCs.  Cardiac telemetry showed several triggered episodes that correlated with  PVCs. Continue metoprolol tartrate 25 mg twice daily. I offered EP  referral. He wants to hold of for now.  F/u in 3 months   [name], MD Pager: [phone] Office: [phone]

## 2021-04-06 ENCOUNTER — Other Ambulatory Visit: Payer: Self-pay

## 2021-04-06 ENCOUNTER — Ambulatory Visit: Payer: Medicare HMO | Admitting: Cardiology

## 2021-04-06 ENCOUNTER — Encounter: Payer: Self-pay | Admitting: Cardiology

## 2021-04-06 VITALS — BP 108/73 | HR 50 | Temp 98.2°F | Ht 71.0 in | Wt 176.0 lb

## 2021-04-06 DIAGNOSIS — I493 Ventricular premature depolarization: Secondary | ICD-10-CM

## 2021-05-15 ENCOUNTER — Encounter: Payer: Self-pay | Admitting: Family Medicine

## 2021-06-22 ENCOUNTER — Other Ambulatory Visit: Payer: Self-pay | Admitting: Family Medicine

## 2021-06-22 DIAGNOSIS — E785 Hyperlipidemia, unspecified: Secondary | ICD-10-CM

## 2021-07-04 ENCOUNTER — Ambulatory Visit: Payer: Medicare HMO | Admitting: Cardiology

## 2021-07-21 NOTE — Progress Notes (Signed)
Patient referred by [name], MD for palpitations  Subjective:   David [name], male    DOB: 14-Jul-1955, 66 y.o.   MRN: [id number]   Chief Complaint  Patient presents with   PVC   Follow-up    3 months     HPI  66 y.o. Caucasian male with symptomatic PVC  Patient continues to have multiple episodes of "heart sinking feeling" throughout the day everyday. Outside of these symptoms, he is doing well.  Initial consultation visit: Patient is very active, bikes 5 to 7 miles regularly and also works outdoors.  He has been experiencing symptoms of palpitations that usually start about 20 minutes or so after eating.  Symptoms may last for few minutes up to several hours.  They are associated with shortness of breath, but no chest pain, presyncope or syncope.  Patient does not smoke, does not drink alcohol, drinks about 1 cup of coffee and 1 soda per day.   Current Outpatient Medications:    atorvastatin (LIPITOR) 10 MG tablet, TAKE 1 TABLET BY MOUTH EVERY DAY, Disp: 90 tablet, Rfl: 0   Cholecalciferol (VITAMIN D) 50 MCG (2000 UT) tablet, Take 2,000 Units by mouth daily., Disp: , Rfl:    metoprolol tartrate (LOPRESSOR) 25 MG tablet, Take 1 tablet (25 mg total) by mouth 2 (two) times daily., Disp: 180 tablet, Rfl: 3   Multiple Vitamin (MULTIVITAMIN) tablet, Take 1 tablet by mouth daily., Disp: , Rfl:     Cardiovascular and other pertinent studies:  EKG 04/06/2021: Sinus bradycardia 50 bpm Otherwise normal EKG  Mobile cardiac telemetry 7 days 12/02/2020 - 12/09/2020: Dominant rhythm: Sinus. HR 39-127 bpm. Avg HR 61 bpm. 1 episodes of probable atrial tachycardia at 103 bpm for 4 beats. <1% isolated SVE, couplets. 0 episodes of VT. 1.7% isolated VE, no couplet/triplets. No atrial fibrillation/atrial flutter/VT/high grade AV block, sinus pause >3sec noted. 43 patient triggered events, most correlated with ventricular ectopy.  Echocardiogram 12/02/2020:  Normal LV  systolic function with visual EF 60-65%. Left ventricle cavity  is normal in size. Mild left ventricular hypertrophy. Normal global wall  motion. Normal diastolic filling pattern, normal LAP.  Mild tricuspid regurgitation. No evidence of pulmonary hypertension.  No prior studies for comparison.   EKG 11/24/2020: Sinus rhythm 67 bpm Normal EKG   Recent labs: 10/07/2020: Glucose 88, BUN/Cr 10/0.94. EGFR 84. Na/K 138/4.4. Rest of the CMP normal H/H 15/46. MCV 93. Platelets 218 HbA1C 5.4% Chol 131, TG 192, HDL 39, LDL 52 TSH 2.1 normal    Review of Systems  Cardiovascular:  Positive for palpitations. Negative for chest pain, dyspnea on exertion, leg swelling and syncope.         Vitals:   07/22/21 0820  BP: 108/70  Pulse: (!) 51  Resp: 16  Temp: 97.8 F (36.6 C)  SpO2: 96%    Body mass index is 24.97 kg/m. Filed Weights   07/22/21 0820  Weight: 179 lb (81.2 kg)     Objective:   Physical Exam Vitals and nursing note reviewed.  Constitutional:      General: He is not in acute distress. Neck:     Vascular: No JVD.  Cardiovascular:     Rate and Rhythm: Normal rate and regular rhythm.     Pulses: Normal pulses.     Heart sounds: Normal heart sounds. No murmur heard. Pulmonary:     Effort: Pulmonary effort is normal.     Breath sounds: Normal breath sounds. No wheezing or  rales.  Musculoskeletal:     Right lower leg: No edema.     Left lower leg: No edema.         Assessment & Recommendations:   66 y.o. Caucasian male with symptomatic PVC  Symptomatic PVC: Low burden, but very symptomatic. Today, we discussed increasing metoprolol tartrate to 25 mg tid vs adding flecainide vs EP referral. He chose to increase metoprolol dose.   F/u in 3 months   [name], MD Pager: [phone] Office: [phone]

## 2021-07-22 ENCOUNTER — Encounter: Payer: Self-pay | Admitting: Cardiology

## 2021-07-22 ENCOUNTER — Ambulatory Visit: Payer: Medicare HMO | Admitting: Cardiology

## 2021-07-22 VITALS — BP 108/70 | HR 51 | Temp 97.8°F | Resp 16 | Ht 71.0 in | Wt 179.0 lb

## 2021-07-22 DIAGNOSIS — I493 Ventricular premature depolarization: Secondary | ICD-10-CM | POA: Diagnosis not present

## 2021-07-22 MED ORDER — METOPROLOL TARTRATE 25 MG PO TABS: 25.0000 mg | ORAL_TABLET | Freq: Three times a day (TID) | ORAL | 3 refills | Status: DC

## 2021-08-10 ENCOUNTER — Ambulatory Visit: Payer: Medicare HMO | Admitting: Dermatology

## 2021-08-10 DIAGNOSIS — D485 Neoplasm of uncertain behavior of skin: Secondary | ICD-10-CM

## 2021-08-10 DIAGNOSIS — C44311 Basal cell carcinoma of skin of nose: Secondary | ICD-10-CM | POA: Diagnosis not present

## 2021-08-10 DIAGNOSIS — L821 Other seborrheic keratosis: Secondary | ICD-10-CM | POA: Diagnosis not present

## 2021-08-10 NOTE — Patient Instructions (Signed)

## 2021-08-17 ENCOUNTER — Telehealth: Payer: Self-pay

## 2021-08-17 NOTE — Telephone Encounter (Signed)
Path to patient and he is aware of the mohs referral.  Release:  [phone]

## 2021-08-17 NOTE — Telephone Encounter (Signed)
-----   Message from [name], MD sent at 08/13/2021 10:08 AM EDT ----- Schedule Mohs

## 2021-09-10 ENCOUNTER — Encounter: Payer: Self-pay | Admitting: Dermatology

## 2021-09-10 NOTE — Progress Notes (Signed)
   Follow-Up Visit   Subjective  David [name] is a 66 y.o. male who presents for the following: Skin Problem (Has lesion on nose x 6 months. It does bleed. Comes and goes. History of non mole skin cancer. ).  Nonhealing spot on nose that has bled, there is blood on face Location:  Duration:  Quality:  Associated Signs/Symptoms: Modifying Factors:  Severity:  Timing: Context:   Objective  Well appearing patient in no apparent distress; mood and affect are within normal limits. Mid Tip of Nose Endophytic ill-defined waxy pink 7 mm lesion, rule out BCC       Left Malar Cheek Brown textured 5 mm flattopped papule, compatible dermoscopy    A focused examination was performed including head and neck. Relevant physical exam findings are noted in the Assessment and Plan.   Assessment & Plan    Neoplasm of uncertain behavior of skin Mid Tip of Nose  Skin / nail biopsy Type of biopsy: tangential   Informed consent: discussed and consent obtained   Timeout: patient name, date of birth, surgical site, and procedure verified   Anesthesia: the lesion was anesthetized in a standard fashion   Anesthetic:  1% lidocaine w/ epinephrine 1-100,000 local infiltration Instrument used: flexible razor blade   Hemostasis achieved with: ferric subsulfate   Outcome: patient tolerated procedure well   Post-procedure details: wound care instructions given    Specimen 1 - Surgical pathology Differential Diagnosis: scc vs bcc If + mohs Check Margins: No  Biopsy obtained, if confirmatory will refer for Mohs surgery.  Seborrheic keratosis Left Malar Cheek  Leave if stable      I, David Monarch, MD, have reviewed all documentation for this visit.  The documentation on 09/10/21 for the exam, diagnosis, procedures, and orders are all accurate and complete.

## 2021-09-21 ENCOUNTER — Other Ambulatory Visit: Payer: Self-pay | Admitting: Family Medicine

## 2021-09-21 DIAGNOSIS — E785 Hyperlipidemia, unspecified: Secondary | ICD-10-CM

## 2021-09-23 ENCOUNTER — Encounter: Payer: Self-pay | Admitting: Family Medicine

## 2021-09-23 ENCOUNTER — Ambulatory Visit (INDEPENDENT_AMBULATORY_CARE_PROVIDER_SITE_OTHER): Payer: Medicare HMO | Admitting: Family Medicine

## 2021-09-23 VITALS — BP 130/72 | HR 60 | Temp 98.5°F | Resp 16 | Ht 69.0 in | Wt 174.5 lb

## 2021-09-23 DIAGNOSIS — Z0000 Encounter for general adult medical examination without abnormal findings: Secondary | ICD-10-CM | POA: Diagnosis not present

## 2021-09-23 DIAGNOSIS — I493 Ventricular premature depolarization: Secondary | ICD-10-CM | POA: Diagnosis not present

## 2021-09-23 DIAGNOSIS — Z23 Encounter for immunization: Secondary | ICD-10-CM | POA: Diagnosis not present

## 2021-09-23 DIAGNOSIS — Z125 Encounter for screening for malignant neoplasm of prostate: Secondary | ICD-10-CM

## 2021-09-23 DIAGNOSIS — E785 Hyperlipidemia, unspecified: Secondary | ICD-10-CM

## 2021-09-23 LAB — LIPID PANEL
Cholesterol: 128 mg/dL (ref 0–200)
HDL: 32.3 mg/dL — ABNORMAL LOW (ref >39.00)
NonHDL: 95.54
Total CHOL/HDL Ratio: 4
Triglycerides: 248 mg/dL — ABNORMAL HIGH (ref 0.0–149.0)
VLDL: 49.6 mg/dL — ABNORMAL HIGH (ref 0.0–40.0)

## 2021-09-23 LAB — COMPREHENSIVE METABOLIC PANEL
ALT: 20 U/L (ref 0–53)
AST: 18 U/L (ref 0–37)
Albumin: 4.5 g/dL (ref 3.5–5.2)
Alkaline Phosphatase: 39 U/L (ref 39–117)
BUN: 11 mg/dL (ref 6–23)
CO2: 28 mEq/L (ref 19–32)
Calcium: 9.5 mg/dL (ref 8.4–10.5)
Chloride: 106 mEq/L (ref 96–112)
Creatinine, Ser: 1 mg/dL (ref 0.40–1.50)
GFR: 78.13 mL/min (ref >60.00)
Glucose, Bld: 95 mg/dL (ref 70–99)
Potassium: 5.1 mEq/L (ref 3.5–5.1)
Sodium: 141 mEq/L (ref 135–145)
Total Bilirubin: 0.6 mg/dL (ref 0.2–1.2)
Total Protein: 6.5 g/dL (ref 6.0–8.3)

## 2021-09-23 LAB — LDL CHOLESTEROL, DIRECT: Direct LDL: 42 mg/dL

## 2021-09-23 LAB — PSA, MEDICARE: PSA: 0.87 ng/ml (ref 0.10–4.00)

## 2021-09-23 MED ORDER — ATORVASTATIN CALCIUM 10 MG PO TABS: 10.0000 mg | ORAL_TABLET | Freq: Every day | ORAL | 3 refills | Status: DC

## 2021-09-23 NOTE — Patient Instructions (Addendum)
If palpitations worsen, contact cardiology as it appears there were some other options if needed for treatment.  No med changes today.  Schedule visit with Dr. Talbert Forest.  I do recommend covid booster, but let me know if there are questions.  Take care!  Preventive Care 41 Years and Older, Male Preventive care refers to lifestyle choices and visits with your health care provider that can promote health and wellness. Preventive care visits are also called wellness exams. What can I expect for my preventive care visit? Counseling During your preventive care visit, your health care provider may ask about your: Medical history, including: Past medical problems. Family medical history. History of falls. Current health, including: Emotional well-being. Home life and relationship well-being. Sexual activity. Memory and ability to understand (cognition). Lifestyle, including: Alcohol, nicotine or tobacco, and drug use. Access to firearms. Diet, exercise, and sleep habits. Work and work Statistician. Sunscreen use. Safety issues such as seatbelt and bike helmet use. Physical exam Your health care provider will check your: Height and weight. These may be used to calculate your BMI (body mass index). BMI is a measurement that tells if you are at a healthy weight. Waist circumference. This measures the distance around your waistline. This measurement also tells if you are at a healthy weight and may help predict your risk of certain diseases, such as type 2 diabetes and high blood pressure. Heart rate and blood pressure. Body temperature. Skin for abnormal spots. What immunizations do I need?  Vaccines are usually given at various ages, according to a schedule. Your health care provider will recommend vaccines for you based on your age, medical history, and lifestyle or other factors, such as travel or where you work. What tests do I need? Screening Your health care provider may recommend  screening tests for certain conditions. This may include: Lipid and cholesterol levels. Diabetes screening. This is done by checking your blood sugar (glucose) after you have not eaten for a while (fasting). Hepatitis C test. Hepatitis B test. HIV (human immunodeficiency virus) test. STI (sexually transmitted infection) testing, if you are at risk. Lung cancer screening. Colorectal cancer screening. Prostate cancer screening. Abdominal aortic aneurysm (AAA) screening. You may need this if you are a current or former smoker. Talk with your health care provider about your test results, treatment options, and if necessary, the need for more tests. Follow these instructions at home: Eating and drinking  Eat a diet that includes fresh fruits and vegetables, whole grains, lean protein, and low-fat dairy products. Limit your intake of foods with high amounts of sugar, saturated fats, and salt. Take vitamin and mineral supplements as recommended by your health care provider. Do not drink alcohol if your health care provider tells you not to drink. If you drink alcohol: Limit how much you have to 0-2 drinks a day. Know how much alcohol is in your drink. In the [place] one drink equals one 12 oz bottle of beer (355 mL), one 5 oz glass of wine (148 mL), or one 1 oz glass of hard liquor (44 mL). Lifestyle Brush your teeth every morning and night with fluoride toothpaste. Floss one time each day. Exercise for at least 30 minutes 5 or more days each week. Do not use any products that contain nicotine or tobacco. These products include cigarettes, chewing tobacco, and vaping devices, such as e-cigarettes. If you need help quitting, ask your health care provider. Do not use drugs. If you are sexually active, practice safe sex. Use a  condom or other form of protection to prevent STIs. Take aspirin only as told by your health care provider. Make sure that you understand how much to take and what form to  take. Work with your health care provider to find out whether it is safe and beneficial for you to take aspirin daily. Ask your health care provider if you need to take a cholesterol-lowering medicine (statin). Find healthy ways to manage stress, such as: Meditation, yoga, or listening to music. Journaling. Talking to a trusted person. Spending time with friends and family. Safety Always wear your seat belt while driving or riding in a vehicle. Do not drive: If you have been drinking alcohol. Do not ride with someone who has been drinking. When you are tired or distracted. While texting. If you have been using any mind-altering substances or drugs. Wear a helmet and other protective equipment during sports activities. If you have firearms in your house, make sure you follow all gun safety procedures. Minimize exposure to UV radiation to reduce your risk of skin cancer. What's next? Visit your health care provider once a year for an annual wellness visit. Ask your health care provider how often you should have your eyes and teeth checked. Stay up to date on all vaccines. This information is not intended to replace advice given to you by your health care provider. Make sure you discuss any questions you have with your health care provider. Document Revised: 07/28/2020 Document Reviewed: 07/28/2020 Elsevier Patient Education  [place].

## 2021-09-23 NOTE — Progress Notes (Signed)
Subjective:  Patient ID: David [name], male    DOB: [birth year]  Age: 66 y.o. MRN: [id number]  CC:  Chief Complaint  Patient presents with   Annual Exam    Fasting     HPI David [name] presents for Annual Exam No health changes since last visit.  Care team: Pcp, me Dermatology, prior [facility], Dr. Denna Haggard - will be establishing with new dermatologist. Will be going to skin surgery center for basal center skin cancer removal in October.  Cardiology, Dr. Virgina Jock, PVCs, appointment June 9, low burden of symptomatic, metoprolol increased to 25 mg 3 times daily, option of adding flecainide or EP referral. Optho,  Dr. Talbert Forest  Hyperlipidemia: Lipitor 10 mg daily, no new myalgias or side effects.  history of PVCs as above followed by cardiology, now treated with metoprolol 25 mg 3 times daily - has been cutting back to 2 per day d/t fatigue. Palpitations overall stable with BID dosing, no benefit with tid dosing.  Normal A1c last year Lab Results  Component Value Date   CHOL 131 09/17/2020   HDL 39.70 09/17/2020   LDLCALC 52 09/17/2020   TRIG 192.0 (H) 09/17/2020   CHOLHDL 3 09/17/2020   Lab Results  Component Value Date   ALT 21 09/17/2020   AST 17 09/17/2020   ALKPHOS 41 09/17/2020   BILITOT 0.5 09/17/2020        09/23/2021    8:09 AM 11/10/2020    9:04 AM 03/19/2020    1:12 PM 03/19/2020    1:04 PM 04/22/2019    8:02 AM  Depression screen PHQ 2/9  Decreased Interest 0 1 0 0 0  Down, Depressed, Hopeless 0 0 0 0 0  PHQ - 2 Score 0 1 0 0 0  Altered sleeping 1      Tired, decreased energy 1      Change in appetite 0      Feeling bad or failure about yourself  0      Trouble concentrating 0      Moving slowly or fidgety/restless 0      Suicidal thoughts 0      PHQ-9 Score 2        Health Maintenance  Topic Date Due   Pneumonia Vaccine 22+ Years old (2 - PCV) 03/19/2021   COVID-19 Vaccine (4 - Moderna risk series) 10/09/2021 (Originally 05/07/2020)    Zoster Vaccines- Shingrix (1 of 2) 12/24/2021 (Originally 09/04/1973)   COLONOSCOPY (Pts 45-48yr Insurance coverage will need to be confirmed)  09/24/2022 (Originally 09/05/1999)   TETANUS/TDAP  09/24/2022 (Originally 09/04/1973)   Hepatitis C Screening  09/24/2022 (Originally 09/04/1972)   HPV VACCINES  Aged Out   INFLUENZA VACCINE  Discontinued  Cologuard - 10/07/2020 negative Prostate: does NOT have family history of prostate cancer The natural history of prostate cancer and ongoing controversy regarding screening and potential treatment outcomes of prostate cancer has been discussed with the patient. The meaning of a false positive PSA and a false negative PSA has been discussed. He indicates understanding of the limitations of this screening test and wishes to proceed with screening PSA testing. Lab Results  Component Value Date   PSA1 1.3 03/19/2020   PSA1 1.3 04/22/2019      Immunization History  Administered Date(s) Administered   Fluad Quad(high Dose 65+) 03/19/2020   Influenza-Unspecified 12/29/2020   Moderna Sars-Covid-2 Vaccination 09/10/2019, 10/09/2019, 03/12/2020   Pneumococcal Polysaccharide-23 03/19/2020  COVID bivalent booster: has not had - recommended. Covid infection  last year.  Prevnar today.   No results found. Followed by ophthalmology, Dr. Talbert Forest - due for visit  [place] 6 months.   Alcohol: none  Tobacco: none  Exercise: active, walking , weights at times and yardwork. At least 18mn per week.   History Patient Active Problem List   Diagnosis Date Noted   PVC (premature ventricular contraction) 01/03/2021   Palpitations 11/23/2020   HLD (hyperlipidemia) 09/17/2020   Past Medical History:  Diagnosis Date   Cataract    Phreesia 03/16/2020   Nodular basal cell carcinoma (BCC) 03/24/2019   Mid Paraspinal ([id number])   Nodular basal cell carcinoma (BCC) 03/24/2019   Right Tragus(MOHS)   Past Surgical History:  Procedure Laterality Date   EYE  SURGERY N/A    Phreesia 03/16/2020   No Known Allergies Prior to Admission medications   Medication Sig Start Date End Date Taking? Authorizing Provider  atorvastatin (LIPITOR) 10 MG tablet TAKE 1 TABLET BY MOUTH EVERY DAY 06/22/21   GWendie Agreste MD  Cholecalciferol (VITAMIN D) 50 MCG (2000 UT) tablet Take 2,000 Units by mouth daily.    [provider]  metoprolol tartrate (LOPRESSOR) 25 MG tablet Take 1 tablet (25 mg total) by mouth in the morning, at noon, and at bedtime. 07/22/21 07/17/22  Patwardhan, MReynold Bowen MD  Multiple Vitamin (MULTIVITAMIN) tablet Take 1 tablet by mouth daily.    [provider]   Social History   Socioeconomic History   Marital status: Married    Spouse name: Not on file   Number of children: 1   Years of education: Not on file   Highest education level: Not on file  Occupational History   Not on file  Tobacco Use   Smoking status: Never   Smokeless tobacco: Never  Vaping Use   Vaping Use: Never used  Substance and Sexual Activity   Alcohol use: Never   Drug use: Never   Sexual activity: Yes  Other Topics Concern   Not on file  Social History Narrative   Not on file   Social Determinants of Health   Financial Resource Strain: Not on file  Food Insecurity: Not on file  Transportation Needs: Not on file  Physical Activity: Not on file  Stress: Not on file  Social Connections: Not on file  Intimate Partner Violence: Not on file    Review of Systems 13 point review of systems per patient health survey noted.  Negative other than as indicated above or in HPI.    Objective:   Vitals:   09/23/21 0811  BP: 130/72  Pulse: 60  Resp: 16  Temp: 98.5 F (36.9 C)  TempSrc: Oral  SpO2: 97%  Weight: 174 lb 8 oz (79.2 kg)  Height: '5\' 9"'$  (1.753 m)     Physical Exam Vitals reviewed.  Constitutional:      Appearance: He is well-developed.  HENT:     Head: Normocephalic and atraumatic.     Right Ear: External ear normal.      Left Ear: External ear normal.  Eyes:     Conjunctiva/sclera: Conjunctivae normal.     Pupils: Pupils are equal, round, and reactive to light.  Neck:     Thyroid: No thyromegaly.  Cardiovascular:     Rate and Rhythm: Normal rate and regular rhythm.     Heart sounds: Normal heart sounds.  Pulmonary:     Effort: Pulmonary effort is normal. No respiratory distress.     Breath sounds: Normal breath sounds.  No wheezing.  Abdominal:     General: There is no distension.     Palpations: Abdomen is soft.     Tenderness: There is no abdominal tenderness.  Musculoskeletal:        General: No tenderness. Normal range of motion.     Cervical back: Normal range of motion and neck supple.  Lymphadenopathy:     Cervical: No cervical adenopathy.  Skin:    General: Skin is warm and dry.  Neurological:     Mental Status: He is alert and oriented to person, place, and time.     Deep Tendon Reflexes: Reflexes are normal and symmetric.  Psychiatric:        Behavior: Behavior normal.        Assessment & Plan:  David [name] is a 66 y.o. male . Annual physical exam - Plan: Comprehensive metabolic panel, Hemoglobin A1c, Lipid panel, PSA, Medicare ( [facility] only)  - -anticipatory guidance as below in AVS, screening labs above. Health maintenance items as above in HPI discussed/recommended as applicable.   PVC (premature ventricular contraction)  -Overall stable on twice daily dosing of metoprolol, minimal change with 3 times daily dosing except for increased fatigue.  Follow-up with cardiology if persistent symptoms as option of EP eval or flecainide.  Hyperlipidemia, unspecified hyperlipidemia type - Plan: Lipid panel, atorvastatin (LIPITOR) 10 MG tablet  -  Stable, tolerating current regimen. Medications refilled. Labs pending as above.   Need for vaccination against Streptococcus pneumoniae - Plan: Pneumococcal conjugate vaccine 13-valent  Screening for prostate cancer - Plan:  PSA, Medicare ( [facility] only)   Meds ordered this encounter  Medications   atorvastatin (LIPITOR) 10 MG tablet    Sig: Take 1 tablet (10 mg total) by mouth daily.    Dispense:  90 tablet    Refill:  3   Patient Instructions  If palpitations worsen, contact cardiology as it appears there were some other options if needed for treatment.  No med changes today.  Schedule visit with Dr. Talbert Forest.  I do recommend covid booster, but let me know if there are questions.  Take care!  Preventive Care 28 Years and Older, Male Preventive care refers to lifestyle choices and visits with your health care provider that can promote health and wellness. Preventive care visits are also called wellness exams. What can I expect for my preventive care visit? Counseling During your preventive care visit, your health care provider may ask about your: Medical history, including: Past medical problems. Family medical history. History of falls. Current health, including: Emotional well-being. Home life and relationship well-being. Sexual activity. Memory and ability to understand (cognition). Lifestyle, including: Alcohol, nicotine or tobacco, and drug use. Access to firearms. Diet, exercise, and sleep habits. Work and work Statistician. Sunscreen use. Safety issues such as seatbelt and bike helmet use. Physical exam Your health care provider will check your: Height and weight. These may be used to calculate your BMI (body mass index). BMI is a measurement that tells if you are at a healthy weight. Waist circumference. This measures the distance around your waistline. This measurement also tells if you are at a healthy weight and may help predict your risk of certain diseases, such as type 2 diabetes and high blood pressure. Heart rate and blood pressure. Body temperature. Skin for abnormal spots. What immunizations do I need?  Vaccines are usually given at various ages, according to a  schedule. Your health care provider will recommend vaccines for you  based on your age, medical history, and lifestyle or other factors, such as travel or where you work. What tests do I need? Screening Your health care provider may recommend screening tests for certain conditions. This may include: Lipid and cholesterol levels. Diabetes screening. This is done by checking your blood sugar (glucose) after you have not eaten for a while (fasting). Hepatitis C test. Hepatitis B test. HIV (human immunodeficiency virus) test. STI (sexually transmitted infection) testing, if you are at risk. Lung cancer screening. Colorectal cancer screening. Prostate cancer screening. Abdominal aortic aneurysm (AAA) screening. You may need this if you are a current or former smoker. Talk with your health care provider about your test results, treatment options, and if necessary, the need for more tests. Follow these instructions at home: Eating and drinking  Eat a diet that includes fresh fruits and vegetables, whole grains, lean protein, and low-fat dairy products. Limit your intake of foods with high amounts of sugar, saturated fats, and salt. Take vitamin and mineral supplements as recommended by your health care provider. Do not drink alcohol if your health care provider tells you not to drink. If you drink alcohol: Limit how much you have to 0-2 drinks a day. Know how much alcohol is in your drink. In the [place] one drink equals one 12 oz bottle of beer (355 mL), one 5 oz glass of wine (148 mL), or one 1 oz glass of hard liquor (44 mL). Lifestyle Brush your teeth every morning and night with fluoride toothpaste. Floss one time each day. Exercise for at least 30 minutes 5 or more days each week. Do not use any products that contain nicotine or tobacco. These products include cigarettes, chewing tobacco, and vaping devices, such as e-cigarettes. If you need help quitting, ask your health care provider. Do  not use drugs. If you are sexually active, practice safe sex. Use a condom or other form of protection to prevent STIs. Take aspirin only as told by your health care provider. Make sure that you understand how much to take and what form to take. Work with your health care provider to find out whether it is safe and beneficial for you to take aspirin daily. Ask your health care provider if you need to take a cholesterol-lowering medicine (statin). Find healthy ways to manage stress, such as: Meditation, yoga, or listening to music. Journaling. Talking to a trusted person. Spending time with friends and family. Safety Always wear your seat belt while driving or riding in a vehicle. Do not drive: If you have been drinking alcohol. Do not ride with someone who has been drinking. When you are tired or distracted. While texting. If you have been using any mind-altering substances or drugs. Wear a helmet and other protective equipment during sports activities. If you have firearms in your house, make sure you follow all gun safety procedures. Minimize exposure to UV radiation to reduce your risk of skin cancer. What's next? Visit your health care provider once a year for an annual wellness visit. Ask your health care provider how often you should have your eyes and teeth checked. Stay up to date on all vaccines. This information is not intended to replace advice given to you by your health care provider. Make sure you discuss any questions you have with your health care provider. Document Revised: 07/28/2020 Document Reviewed: 07/28/2020 Elsevier Patient Education  [place].        Signed,   Merri Ray, MD [facility] [place]  [facility] 09/23/21 8:34 AM

## 2021-10-25 NOTE — Progress Notes (Unsigned)
Patient referred by [name], MD for palpitations  Subjective:   David [name], male    DOB: Sep 21, 1955, 66 y.o.   MRN: [id number]   No chief complaint on file.    HPI  66 y.o. Caucasian male with symptomatic PVC  Patient continues to have multiple episodes of "heart sinking feeling" throughout the day everyday. Outside of these symptoms, he is doing well.  Initial consultation visit: Patient is very active, bikes 5 to 7 miles regularly and also works outdoors.  He has been experiencing symptoms of palpitations that usually start about 20 minutes or so after eating.  Symptoms may last for few minutes up to several hours.  They are associated with shortness of breath, but no chest pain, presyncope or syncope.  Patient does not smoke, does not drink alcohol, drinks about 1 cup of coffee and 1 soda per day.   Current Outpatient Medications:  .  atorvastatin (LIPITOR) 10 MG tablet, Take 1 tablet (10 mg total) by mouth daily., Disp: 90 tablet, Rfl: 3 .  Cholecalciferol (VITAMIN D) 50 MCG (2000 UT) tablet, Take 2,000 Units by mouth daily., Disp: , Rfl:  .  metoprolol tartrate (LOPRESSOR) 25 MG tablet, Take 1 tablet (25 mg total) by mouth in the morning, at noon, and at bedtime., Disp: 180 tablet, Rfl: 3 .  Multiple Vitamin (MULTIVITAMIN) tablet, Take 1 tablet by mouth daily., Disp: , Rfl:     Cardiovascular and other pertinent studies:  EKG 04/06/2021: Sinus bradycardia 50 bpm Otherwise normal EKG  Mobile cardiac telemetry 7 days 12/02/2020 - 12/09/2020: Dominant rhythm: Sinus. HR 39-127 bpm. Avg HR 61 bpm. 1 episodes of probable atrial tachycardia at 103 bpm for 4 beats. <1% isolated SVE, couplets. 0 episodes of VT. 1.7% isolated VE, no couplet/triplets. No atrial fibrillation/atrial flutter/VT/high grade AV block, sinus pause >3sec noted. 66 patient triggered events, most correlated with ventricular ectopy.  Echocardiogram 12/02/2020:  Normal LV systolic  function with visual EF 60-65%. Left ventricle cavity  is normal in size. Mild left ventricular hypertrophy. Normal global wall  motion. Normal diastolic filling pattern, normal LAP.  Mild tricuspid regurgitation. No evidence of pulmonary hypertension.  No prior studies for comparison.   EKG 11/24/2020: Sinus rhythm 67 bpm Normal EKG   Recent labs: 10/07/2020: Glucose 88, BUN/Cr 10/0.94. EGFR 84. Na/K 138/4.4. Rest of the CMP normal H/H 15/46. MCV 93. Platelets 218 HbA1C 5.4% Chol 131, TG 192, HDL 39, LDL 52 TSH 2.1 normal    Review of Systems  Cardiovascular:  Positive for palpitations. Negative for chest pain, dyspnea on exertion, leg swelling and syncope.         There were no vitals filed for this visit.   There is no height or weight on file to calculate BMI. There were no vitals filed for this visit.    Objective:   Physical Exam Vitals and nursing note reviewed.  Constitutional:      General: He is not in acute distress. Neck:     Vascular: No JVD.  Cardiovascular:     Rate and Rhythm: Normal rate and regular rhythm.     Pulses: Normal pulses.     Heart sounds: Normal heart sounds. No murmur heard. Pulmonary:     Effort: Pulmonary effort is normal.     Breath sounds: Normal breath sounds. No wheezing or rales.  Musculoskeletal:     Right lower leg: No edema.     Left lower leg: No edema.  Assessment & Recommendations:   66 y.o. Caucasian male with symptomatic PVC  Symptomatic PVC: Low burden, but very symptomatic. Today, we discussed increasing metoprolol tartrate to 25 mg tid vs adding flecainide vs EP referral. He chose to increase metoprolol dose.   F/u in 3 months   [name], MD Pager: [phone] Office: [phone]

## 2021-10-26 ENCOUNTER — Encounter: Payer: Self-pay | Admitting: Cardiology

## 2021-10-26 ENCOUNTER — Ambulatory Visit: Payer: Medicare HMO | Admitting: Cardiology

## 2021-10-26 VITALS — BP 113/69 | HR 46 | Temp 97.7°F | Resp 16 | Ht 69.0 in | Wt 175.0 lb

## 2021-10-26 DIAGNOSIS — I493 Ventricular premature depolarization: Secondary | ICD-10-CM | POA: Diagnosis not present

## 2021-10-26 DIAGNOSIS — E781 Pure hyperglyceridemia: Secondary | ICD-10-CM

## 2021-10-26 MED ORDER — METOPROLOL TARTRATE 25 MG PO TABS: 25.0000 mg | ORAL_TABLET | Freq: Three times a day (TID) | ORAL | 3 refills | Status: DC

## 2021-10-31 ENCOUNTER — Ambulatory Visit: Payer: Medicare HMO | Admitting: Dermatology

## 2021-11-24 DIAGNOSIS — C44311 Basal cell carcinoma of skin of nose: Secondary | ICD-10-CM | POA: Diagnosis not present

## 2022-01-12 DIAGNOSIS — D485 Neoplasm of uncertain behavior of skin: Secondary | ICD-10-CM | POA: Diagnosis not present

## 2022-01-12 DIAGNOSIS — C44329 Squamous cell carcinoma of skin of other parts of face: Secondary | ICD-10-CM | POA: Diagnosis not present

## 2022-01-24 ENCOUNTER — Other Ambulatory Visit: Payer: Self-pay | Admitting: Cardiology

## 2022-01-24 DIAGNOSIS — I493 Ventricular premature depolarization: Secondary | ICD-10-CM

## 2022-02-16 DIAGNOSIS — D0439 Carcinoma in situ of skin of other parts of face: Secondary | ICD-10-CM | POA: Diagnosis not present

## 2022-04-19 DIAGNOSIS — L821 Other seborrheic keratosis: Secondary | ICD-10-CM | POA: Diagnosis not present

## 2022-04-19 DIAGNOSIS — D485 Neoplasm of uncertain behavior of skin: Secondary | ICD-10-CM | POA: Diagnosis not present

## 2022-04-19 DIAGNOSIS — Z08 Encounter for follow-up examination after completed treatment for malignant neoplasm: Secondary | ICD-10-CM | POA: Diagnosis not present

## 2022-04-19 DIAGNOSIS — L814 Other melanin hyperpigmentation: Secondary | ICD-10-CM | POA: Diagnosis not present

## 2022-04-19 DIAGNOSIS — D225 Melanocytic nevi of trunk: Secondary | ICD-10-CM | POA: Diagnosis not present

## 2022-04-19 DIAGNOSIS — Z85828 Personal history of other malignant neoplasm of skin: Secondary | ICD-10-CM | POA: Diagnosis not present

## 2022-04-27 ENCOUNTER — Ambulatory Visit: Payer: Medicare HMO | Admitting: Cardiology

## 2022-04-27 ENCOUNTER — Encounter: Payer: Self-pay | Admitting: Cardiology

## 2022-04-27 VITALS — BP 101/60 | HR 50 | Resp 16 | Ht 69.0 in | Wt 170.0 lb

## 2022-04-27 DIAGNOSIS — I493 Ventricular premature depolarization: Secondary | ICD-10-CM

## 2022-04-27 DIAGNOSIS — E781 Pure hyperglyceridemia: Secondary | ICD-10-CM | POA: Diagnosis not present

## 2022-04-27 MED ORDER — METOPROLOL TARTRATE 25 MG PO TABS: 50.0000 mg | ORAL_TABLET | Freq: Two times a day (BID) | ORAL | 0 refills | Status: DC

## 2022-04-27 NOTE — Progress Notes (Signed)
Patient referred by David Agreste, MD for palpitations  Subjective:   David [name], male    DOB: 01-30-1956, 67 y.o.   MRN: [id number]   Chief Complaint  Patient presents with   PVC   Follow-up    6 month    HPI  67 y.o. Caucasian male with symptomatic PVC  Patient continues to have multiple short lasting episodes of palpitations throughout the day.  Initial consultation visit: Patient is very active, bikes 5 to 7 miles regularly and also works outdoors.  He has been experiencing symptoms of palpitations that usually start about 20 minutes or so after eating.  Symptoms may last for few minutes up to several hours.  They are associated with shortness of breath, but no chest pain, presyncope or syncope.  Patient does not smoke, does not drink alcohol, drinks about 1 cup of coffee and 1 soda per day.   Current Outpatient Medications:    atorvastatin (LIPITOR) 10 MG tablet, Take 1 tablet (10 mg total) by mouth daily., Disp: 90 tablet, Rfl: 3   Cholecalciferol (VITAMIN D) 50 MCG (2000 UT) tablet, Take 2,000 Units by mouth daily., Disp: , Rfl:    metoprolol tartrate (LOPRESSOR) 25 MG tablet, TAKE 1 TABLET (25 MG TOTAL) BY MOUTH IN THE MORNING, AT NOON, AND AT BEDTIME., Disp: 270 tablet, Rfl: 2   Multiple Vitamin (MULTIVITAMIN) tablet, Take 1 tablet by mouth daily., Disp: , Rfl:     Cardiovascular and other pertinent studies:  EKG 04/27/2022: Sinus bradycardia 49 bpm No PVC  Mobile cardiac telemetry 7 days 12/02/2020 - 12/09/2020: Dominant rhythm: Sinus. HR 39-127 bpm. Avg HR 61 bpm. 1 episodes of probable atrial tachycardia at 103 bpm for 4 beats. <1% isolated SVE, couplets. 0 episodes of VT. 1.7% isolated VE, no couplet/triplets. No atrial fibrillation/atrial flutter/VT/high grade AV block, sinus pause >3sec noted. 43 patient triggered events, most correlated with ventricular ectopy.  Echocardiogram 12/02/2020:  Normal LV systolic function with visual EF 60-65%.  Left ventricle cavity  is normal in size. Mild left ventricular hypertrophy. Normal global wall  motion. Normal diastolic filling pattern, normal LAP.  Mild tricuspid regurgitation. No evidence of pulmonary hypertension.  No prior studies for comparison.    Recent labs: 09/23/2021: Glucose 95, BUN/Cr 11/1.0. EGFR 78. Na/K 141/5.1. Rest of the CMP normal Chol 128, TG 248, HDL 32, dLDL 42  10/07/2020: Glucose 88, BUN/Cr 10/0.94. EGFR 84. Na/K 138/4.4. Rest of the CMP normal H/H 15/46. MCV 93. Platelets 218 HbA1C 5.4% Chol 131, TG 192, HDL 39, LDL 52 TSH 2.1 normal    Review of Systems  Cardiovascular:  Positive for palpitations. Negative for chest pain, dyspnea on exertion, leg swelling and syncope.         Vitals:   04/27/22 0831  BP: 101/60  Pulse: (!) 50  Resp: 16  SpO2: 96%    Body mass index is 25.1 kg/m. Filed Weights   04/27/22 0831  Weight: 170 lb (77.1 kg)     Objective:   Physical Exam Vitals and nursing note reviewed.  Constitutional:      General: He is not in acute distress. Neck:     Vascular: No JVD.  Cardiovascular:     Rate and Rhythm: Normal rate and regular rhythm.     Pulses: Normal pulses.     Heart sounds: Normal heart sounds. No murmur heard. Pulmonary:     Effort: Pulmonary effort is normal.     Breath sounds: Normal breath sounds.  No wheezing or rales.  Musculoskeletal:     Right lower leg: No edema.     Left lower leg: No edema.         Assessment & Recommendations:   67 y.o. Caucasian male with symptomatic PVC  Symptomatic PVC: Low burden, but very symptomatic. He has noticed certain food triggers, such as bowl of cereal.  Currently on metoprolol tartrate 25 mg tid.  I discussed AAD possibly with flecainide. He wants to hold off for now. Regardless, I will obtain updated echocardiogram and exercise treadmill stress test for the eventual possibility of adding flecainide. Hold metoprolol the night before and morning of  stress test. For now, increased metoprolol tartrate 50 mg bid.  Resting heart rate high 40s but increased with physical activity.   Hypertriglyceridemia: Chol 128, TG 248, HDL 32, dLDL 42 (09/2021). Discussed low carbohydrate, low saturated fat diet. Check fasting lipid panel.  F/u in 2 months    [name], MD Pager: [phone] Office: [phone]

## 2022-05-09 ENCOUNTER — Ambulatory Visit: Payer: Medicare HMO

## 2022-05-09 DIAGNOSIS — I493 Ventricular premature depolarization: Secondary | ICD-10-CM

## 2022-05-09 DIAGNOSIS — R0602 Shortness of breath: Secondary | ICD-10-CM | POA: Diagnosis not present

## 2022-05-26 ENCOUNTER — Ambulatory Visit: Payer: Medicare HMO

## 2022-05-26 DIAGNOSIS — R0609 Other forms of dyspnea: Secondary | ICD-10-CM | POA: Diagnosis not present

## 2022-05-26 DIAGNOSIS — I493 Ventricular premature depolarization: Secondary | ICD-10-CM

## 2022-05-26 DIAGNOSIS — R002 Palpitations: Secondary | ICD-10-CM | POA: Diagnosis not present

## 2022-06-29 ENCOUNTER — Ambulatory Visit: Payer: Medicare HMO | Admitting: Cardiology

## 2022-06-29 ENCOUNTER — Encounter: Payer: Self-pay | Admitting: Cardiology

## 2022-06-29 VITALS — BP 106/61 | HR 47 | Ht 70.0 in | Wt 171.0 lb

## 2022-06-29 DIAGNOSIS — I493 Ventricular premature depolarization: Secondary | ICD-10-CM | POA: Diagnosis not present

## 2022-06-29 DIAGNOSIS — E781 Pure hyperglyceridemia: Secondary | ICD-10-CM | POA: Diagnosis not present

## 2022-06-29 NOTE — Progress Notes (Signed)
Patient referred by Shade Flood, MD for palpitations  Subjective:   David [name], male    DOB: 1956/02/08, 67 y.o.   MRN: [id number]   Chief Complaint  Patient presents with   PVC (premature ventricular contraction)   Follow-up    HPI  67 y.o. Caucasian male with symptomatic PVC  Patient is feeling better.  He has not had as much palpitation symptoms.  He usually notices them after meal if he sits down, but does not notice them when he is active. Reviewed recent test results with the patient, details below.    Initial consultation visit: Patient is very active, bikes 5 to 7 miles regularly and also works outdoors.  He has been experiencing symptoms of palpitations that usually start about 20 minutes or so after eating.  Symptoms may last for few minutes up to several hours.  They are associated with shortness of breath, but no chest pain, presyncope or syncope.  Patient does not smoke, does not drink alcohol, drinks about 1 cup of coffee and 1 soda per day.   Current Outpatient Medications:    atorvastatin (LIPITOR) 10 MG tablet, Take 1 tablet (10 mg total) by mouth daily., Disp: 90 tablet, Rfl: 3   metoprolol tartrate (LOPRESSOR) 25 MG tablet, Take 2 tablets (50 mg total) by mouth 2 (two) times daily., Disp: 1 tablet, Rfl: 0   Multiple Vitamin (MULTIVITAMIN) tablet, Take 1 tablet by mouth daily., Disp: , Rfl:     Cardiovascular and other pertinent studies:  EKG 04/27/2022: Sinus bradycardia 49 bpm No PVC  Echocardiogram 05/09/2022:  Normal LV systolic function with visual EF 60-65%. Left ventricle cavity  is normal in size. Normal left ventricular wall thickness. Normal global  wall motion. Normal diastolic filling pattern, normal LAP. Calculated EF  73%.  Structurally normal tricuspid valve with trace regurgitation. No evidence  of pulmonary hypertension.  No significant change compared to 11/2020.    Exercise treadmill stress test 05/26/2022: Exercise  treadmill stress test performed using Bruce protocol.  Patient reached 10.1 METS, and 95% of age predicted maximum heart rate.  Exercise capacity was good.  No chest pain reported.  Normal heart rate and hemodynamic response. Stress EKG revealed no ischemic changes. Low risk study.   Mobile cardiac telemetry 7 days 12/02/2020 - 12/09/2020: Dominant rhythm: Sinus. HR 39-127 bpm. Avg HR 61 bpm. 1 episodes of probable atrial tachycardia at 103 bpm for 4 beats. <1% isolated SVE, couplets. 0 episodes of VT. 1.7% isolated VE, no couplet/triplets. No atrial fibrillation/atrial flutter/VT/high grade AV block, sinus pause >3sec noted. 43 patient triggered events, most correlated with ventricular ectopy.  Recent labs: 09/23/2021: Glucose 95, BUN/Cr 11/1.0. EGFR 78. Na/K 141/5.1. Rest of the CMP normal Chol 128, TG 248, HDL 32, dLDL 42  10/07/2020: Glucose 88, BUN/Cr 10/0.94. EGFR 84. Na/K 138/4.4. Rest of the CMP normal H/H 15/46. MCV 93. Platelets 218 HbA1C 5.4% Chol 131, TG 192, HDL 39, LDL 52 TSH 2.1 normal    Review of Systems  Cardiovascular:  Positive for palpitations. Negative for chest pain, dyspnea on exertion, leg swelling and syncope.         Vitals:   06/29/22 0836  BP: 106/61  Pulse: (!) 47  SpO2: 97%    Body mass index is 24.54 kg/m. Filed Weights   06/29/22 0836  Weight: 171 lb (77.6 kg)     Objective:   Physical Exam Vitals and nursing note reviewed.  Constitutional:  General: He is not in acute distress. Neck:     Vascular: No JVD.  Cardiovascular:     Rate and Rhythm: Normal rate and regular rhythm.     Pulses: Normal pulses.     Heart sounds: Normal heart sounds. No murmur heard. Pulmonary:     Effort: Pulmonary effort is normal.     Breath sounds: Normal breath sounds. No wheezing or rales.  Musculoskeletal:     Right lower leg: No edema.     Left lower leg: No edema.         Assessment & Recommendations:   67 y.o. Caucasian male  with symptomatic PVC  Symptomatic PVC: Low burden, but very symptomatic. Symptoms are now improved with metoprolol tartrate 50 mg twice daily.   Resting heart rate in 40s, but increases appropriately, as noted on exercise treadmill stress test that showed excellent exercise capacity and only occasional PVC. Structurally normal heart. Continue metoprolol tartrate 50 mg twice daily.    Hypertriglyceridemia: Chol 128, TG 248, HDL 32, dLDL 42 (09/2021). Discussed low carbohydrate, low saturated fat diet. Can be rechecked at next annual labs with PCP.  F/u in 6 months    Elder Negus, MD Pager: [phone] Office: [phone]

## 2022-09-09 ENCOUNTER — Other Ambulatory Visit: Payer: Self-pay | Admitting: Family Medicine

## 2022-09-09 DIAGNOSIS — E785 Hyperlipidemia, unspecified: Secondary | ICD-10-CM

## 2022-09-13 ENCOUNTER — Encounter (INDEPENDENT_AMBULATORY_CARE_PROVIDER_SITE_OTHER): Payer: Self-pay

## 2022-09-24 ENCOUNTER — Other Ambulatory Visit: Payer: Self-pay | Admitting: Cardiology

## 2022-09-24 DIAGNOSIS — I493 Ventricular premature depolarization: Secondary | ICD-10-CM

## 2022-09-28 ENCOUNTER — Ambulatory Visit (INDEPENDENT_AMBULATORY_CARE_PROVIDER_SITE_OTHER): Payer: Medicare HMO | Admitting: Family Medicine

## 2022-09-28 ENCOUNTER — Encounter: Payer: Self-pay | Admitting: Family Medicine

## 2022-09-28 VITALS — BP 120/72 | HR 58 | Temp 97.9°F | Ht 69.0 in | Wt 173.0 lb

## 2022-09-28 DIAGNOSIS — E785 Hyperlipidemia, unspecified: Secondary | ICD-10-CM | POA: Diagnosis not present

## 2022-09-28 DIAGNOSIS — Z130 Encounter for screening for diseases of the blood and blood-forming organs and certain disorders involving the immune mechanism: Secondary | ICD-10-CM

## 2022-09-28 DIAGNOSIS — Z125 Encounter for screening for malignant neoplasm of prostate: Secondary | ICD-10-CM

## 2022-09-28 DIAGNOSIS — I493 Ventricular premature depolarization: Secondary | ICD-10-CM

## 2022-09-28 DIAGNOSIS — Z0000 Encounter for general adult medical examination without abnormal findings: Secondary | ICD-10-CM

## 2022-09-28 DIAGNOSIS — Z1159 Encounter for screening for other viral diseases: Secondary | ICD-10-CM | POA: Diagnosis not present

## 2022-09-28 LAB — LIPID PANEL
Cholesterol: 154 mg/dL (ref 0–200)
HDL: 39.3 mg/dL (ref >39.00)
NonHDL: 114.92
Total CHOL/HDL Ratio: 4
Triglycerides: 249 mg/dL — ABNORMAL HIGH (ref 0.0–149.0)
VLDL: 49.8 mg/dL — ABNORMAL HIGH (ref 0.0–40.0)

## 2022-09-28 LAB — CBC WITH DIFFERENTIAL/PLATELET
Basophils Absolute: 0 10*3/uL (ref 0.0–0.1)
Basophils Relative: 0.4 % (ref 0.0–3.0)
Eosinophils Absolute: 0.2 10*3/uL (ref 0.0–0.7)
Eosinophils Relative: 2.1 % (ref 0.0–5.0)
HCT: 48.1 % (ref 39.0–52.0)
Hemoglobin: 16.1 g/dL (ref 13.0–17.0)
Lymphocytes Relative: 34.4 % (ref 12.0–46.0)
Lymphs Abs: 2.5 10*3/uL (ref 0.7–4.0)
MCHC: 33.4 g/dL (ref 30.0–36.0)
MCV: 95.1 fl (ref 78.0–100.0)
Monocytes Absolute: 0.6 10*3/uL (ref 0.1–1.0)
Monocytes Relative: 8.4 % (ref 3.0–12.0)
Neutro Abs: 4 10*3/uL (ref 1.4–7.7)
Neutrophils Relative %: 54.7 % (ref 43.0–77.0)
Platelets: 219 10*3/uL (ref 150.0–400.0)
RBC: 5.05 Mil/uL (ref 4.22–5.81)
RDW: 12.2 % (ref 11.5–15.5)
WBC: 7.2 10*3/uL (ref 4.0–10.5)

## 2022-09-28 LAB — COMPREHENSIVE METABOLIC PANEL
ALT: 24 U/L (ref 0–53)
AST: 18 U/L (ref 0–37)
Albumin: 4.8 g/dL (ref 3.5–5.2)
Alkaline Phosphatase: 40 U/L (ref 39–117)
BUN: 13 mg/dL (ref 6–23)
CO2: 31 mEq/L (ref 19–32)
Calcium: 9.9 mg/dL (ref 8.4–10.5)
Chloride: 101 mEq/L (ref 96–112)
Creatinine, Ser: 1.01 mg/dL (ref 0.40–1.50)
GFR: 76.66 mL/min (ref >60.00)
Glucose, Bld: 94 mg/dL (ref 70–99)
Potassium: 5 mEq/L (ref 3.5–5.1)
Sodium: 138 mEq/L (ref 135–145)
Total Bilirubin: 0.5 mg/dL (ref 0.2–1.2)
Total Protein: 6.9 g/dL (ref 6.0–8.3)

## 2022-09-28 LAB — LDL CHOLESTEROL, DIRECT: Direct LDL: 66 mg/dL

## 2022-09-28 LAB — PSA, MEDICARE: PSA: 1.7 ng/ml (ref 0.10–4.00)

## 2022-09-28 NOTE — Progress Notes (Signed)
Subjective:  Patient ID: David [name], male    DOB: Jul 10, 1955  Age: 67 y.o. MRN: [id number]  CC:  Chief Complaint  Patient presents with   Annual Exam    Pt doing well, no concerns, fasting     HPI [name] presents for Annual Exam  PCP, [facility], Dr. Rosemary Holms, PVCs, last visit in May.  Low burden but symptomatic.  Improved with metoprolol 50 mg twice daily.  Continued same dose.  Still doing well on this dose. Better on thsi dose. Exercise treadmill test showed excellent exercise capacity and occasional PVC per cardiology notes. Dermatology, Herma Mering, dermatology March visit for melanocytic nevi, seborrheic keratoses. Optho, Dr. Vonna Kotyk  Hyperlipidemia: Lipitor 10 mg daily, no new side effects/myalgias.  Lab Results  Component Value Date   CHOL 128 09/23/2021   HDL 32.30 (L) 09/23/2021   LDLCALC 52 09/17/2020   LDLDIRECT 42.0 09/23/2021   TRIG 248.0 (H) 09/23/2021   CHOLHDL 4 09/23/2021   Lab Results  Component Value Date   ALT 20 09/23/2021   AST 18 09/23/2021   ALKPHOS 39 09/23/2021   BILITOT 0.6 09/23/2021    Lab Results  Component Value Date   HGBA1C 5.4 09/17/2020        09/28/2022    8:50 AM 09/23/2021    8:09 AM 11/10/2020    9:04 AM 03/19/2020    1:12 PM 03/19/2020    1:04 PM  Depression screen PHQ 2/9  Decreased Interest 0 0 1 0 0  Down, Depressed, Hopeless 0 0 0 0 0  PHQ - 2 Score 0 0 1 0 0  Altered sleeping 1 1     Tired, decreased energy 0 1     Change in appetite 0 0     Feeling bad or failure about yourself  0 0     Trouble concentrating 0 0     Moving slowly or fidgety/restless 0 0     Suicidal thoughts 0 0     PHQ-9 Score 1 2       Health Maintenance  Topic Date Due   Hepatitis C Screening  Never done   DTaP/Tdap/Td (1 - Tdap) Never done   Zoster Vaccines- Shingrix (1 of 2) Never done   Medicare Annual Wellness (AWV)  09/17/2021   COVID-19 Vaccine (4 - 2023-24 season) 10/14/2021   Fecal DNA (Cologuard)   10/08/2023   Pneumonia Vaccine 57+ Years old  Completed   HPV VACCINES  Aged Out   INFLUENZA VACCINE  Discontinued  Cologuard negative in 09/2020.  Prostate: does NOT have family history of prostate cancer The natural history of prostate cancer and ongoing controversy regarding screening and potential treatment outcomes of prostate cancer has been discussed with the patient. The meaning of a false positive PSA and a false negative PSA has been discussed. He indicates understanding of the limitations of this screening test and wishes to proceed with screening PSA testing. Lab Results  Component Value Date   PSA1 1.3 03/19/2020   PSA1 1.3 04/22/2019   PSA 0.87 09/23/2021    Immunization History  Administered Date(s) Administered   Fluad Quad(high Dose 65+) 03/19/2020   Influenza-Unspecified 12/29/2020   Moderna Sars-Covid-2 Vaccination 09/10/2019, 10/09/2019, 03/12/2020   Pneumococcal Conjugate-13 09/23/2021   Pneumococcal Polysaccharide-23 03/19/2020  Covid booster, flu vaccine recommended when available RSV vaccine option discussed.  Shingrix - declined.   No results found.  Optho - due. Plans appt. Has glasses if needed.   Dental:visit  last month - every 22mo.   Alcohol:none  Tobacco: none  Exercise: yardwork. 4 acre mowing, yard upkeep. Some biking.    History Patient Active Problem List   Diagnosis Date Noted   PVC (premature ventricular contraction) 01/03/2021   Palpitations 11/23/2020   Hypertriglyceridemia 09/17/2020   Past Medical History:  Diagnosis Date   Cataract    Phreesia 03/16/2020   Nodular basal cell carcinoma (BCC) 03/24/2019   Mid Paraspinal ([id number])   Nodular basal cell carcinoma (BCC) 03/24/2019   Right Tragus(MOHS)   Past Surgical History:  Procedure Laterality Date   EYE SURGERY N/A    Phreesia 03/16/2020   NOSE SURGERY     No Known Allergies Prior to Admission medications   Medication Sig Start Date End Date Taking? Authorizing Provider   atorvastatin (LIPITOR) 10 MG tablet TAKE 1 TABLET BY MOUTH EVERY DAY 09/11/22  Yes Shade Flood, MD  metoprolol tartrate (LOPRESSOR) 25 MG tablet TAKE 1 TABLET (25 MG TOTAL) BY MOUTH IN THE MORNING, AT NOON, AND AT BEDTIME. 09/25/22  Yes Patwardhan, Manish J, MD  Multiple Vitamin (MULTIVITAMIN) tablet Take 1 tablet by mouth daily.   Yes [provider]   Social History   Socioeconomic History   Marital status: Married    Spouse name: Not on file   Number of children: 1   Years of education: Not on file   Highest education level: Not on file  Occupational History   Not on file  Tobacco Use   Smoking status: Never   Smokeless tobacco: Never  Vaping Use   Vaping status: Never Used  Substance and Sexual Activity   Alcohol use: Never   Drug use: Never   Sexual activity: Yes  Other Topics Concern   Not on file  Social History Narrative   Not on file   Social Determinants of Health   Financial Resource Strain: Not on file  Food Insecurity: Not on file  Transportation Needs: Not on file  Physical Activity: Not on file  Stress: Not on file  Social Connections: Not on file  Intimate Partner Violence: Not on file    Review of Systems 13 point review of systems per patient health survey noted.  Negative other than as indicated above or in HPI.   Objective:   Vitals:   09/28/22 0843  BP: 120/72  Pulse: (!) 58  Temp: 97.9 F (36.6 C)  TempSrc: Temporal  SpO2: 97%  Weight: 173 lb (78.5 kg)  Height: 5\' 9"  (1.753 m)     Physical Exam Vitals reviewed.  Constitutional:      Appearance: He is well-developed.  HENT:     Head: Normocephalic and atraumatic.     Right Ear: External ear normal.     Left Ear: External ear normal.  Eyes:     Conjunctiva/sclera: Conjunctivae normal.     Pupils: Pupils are equal, round, and reactive to light.  Neck:     Thyroid: No thyromegaly.  Cardiovascular:     Rate and Rhythm: Normal rate and regular rhythm.     Heart  sounds: Normal heart sounds.  Pulmonary:     Effort: Pulmonary effort is normal. No respiratory distress.     Breath sounds: Normal breath sounds. No wheezing.  Abdominal:     General: There is no distension.     Palpations: Abdomen is soft.     Tenderness: There is no abdominal tenderness.  Musculoskeletal:        General: No tenderness. Normal  range of motion.     Cervical back: Normal range of motion and neck supple.  Lymphadenopathy:     Cervical: No cervical adenopathy.  Skin:    General: Skin is warm and dry.  Neurological:     Mental Status: He is alert and oriented to person, place, and time.     Deep Tendon Reflexes: Reflexes are normal and symmetric.  Psychiatric:        Behavior: Behavior normal.     Assessment & Plan:  DEQUINCY CLEMENSEN is a 67 y.o. male . Annual physical exam - Plan: CBC with Differential/Platelet, Comprehensive metabolic panel, Lipid panel, PSA, Medicare  - -anticipatory guidance as below in AVS, screening labs above. Health maintenance items as above in HPI discussed/recommended as applicable.   Hyperlipidemia, unspecified hyperlipidemia type - Plan: Comprehensive metabolic panel, Lipid panel  -Tolerating statin, continue same dose, check labs and adjust plan accordingly  Screening for deficiency anemia - Plan: CBC with Differential/Platelet  Screening for prostate cancer - Plan: PSA, Medicare  Need for hepatitis C screening test - Plan: Hepatitis C Antibody  PVC (premature ventricular contraction) - Plan: CBC with Differential/Platelet  -Stable with metoprolol, followed by cardiology, no changes.  No orders of the defined types were placed in this encounter.  Patient Instructions  No med changes today.  I will let you know if there are concerns on labs. RSV vaccine option at your pharmacy - see info below.  COVID booster and flu vaccine should also be available at your pharmacy soon.  Let me know if there are questions. Take care!  There  is a recommendation for RSV vaccine for patients over age 98.   Typically would recommend the RSV vaccine as most important for patients over age 28 that have comorbidities that put them at increased risk for severe disease (heart disease such as congestive heart failure, coronary artery disease, lung disease such as asthma or COPD, kidney disease, liver disease, diabetes, chronic or progressive neurologic or muscular conditions, immunosuppressed, or being frail or of advanced age).  For others that do not have these risk factors, there still is some benefit from vaccination since age is one of the main risk factors for developing severe disease however baseline risk of developing severe disease and requiring hospitalization is likely to be lower compared to those that have comorbidities in addition to age.   CDC does have some information as well: Toy[employer]  Preventive Care 79 Years and Older, Male Preventive care refers to lifestyle choices and visits with your health care provider that can promote health and wellness. Preventive care visits are also called wellness exams. What can I expect for my preventive care visit? Counseling During your preventive care visit, your health care provider may ask about your: Medical history, including: Past medical problems. Family medical history. History of falls. Current health, including: Emotional well-being. Home life and relationship well-being. Sexual activity. Memory and ability to understand (cognition). Lifestyle, including: Alcohol, nicotine or tobacco, and drug use. Access to firearms. Diet, exercise, and sleep habits. Work and work Astronomer. Sunscreen use. Safety issues such as seatbelt and bike helmet use. Physical exam Your health care provider will check your: Height and weight. These may be used to calculate your BMI (body mass index). BMI is a measurement that tells if you are at  a healthy weight. Waist circumference. This measures the distance around your waistline. This measurement also tells if you are at a healthy weight and may help predict your risk  of certain diseases, such as type 2 diabetes and high blood pressure. Heart rate and blood pressure. Body temperature. Skin for abnormal spots. What immunizations do I need?  Vaccines are usually given at various ages, according to a schedule. Your health care provider will recommend vaccines for you based on your age, medical history, and lifestyle or other factors, such as travel or where you work. What tests do I need? Screening Your health care provider may recommend screening tests for certain conditions. This may include: Lipid and cholesterol levels. Diabetes screening. This is done by checking your blood sugar (glucose) after you have not eaten for a while (fasting). Hepatitis C test. Hepatitis B test. HIV (human immunodeficiency virus) test. STI (sexually transmitted infection) testing, if you are at risk. Lung cancer screening. Colorectal cancer screening. Prostate cancer screening. Abdominal aortic aneurysm (AAA) screening. You may need this if you are a current or former smoker. Talk with your health care provider about your test results, treatment options, and if necessary, the need for more tests. Follow these instructions at home: Eating and drinking  Eat a diet that includes fresh fruits and vegetables, whole grains, lean protein, and low-fat dairy products. Limit your intake of foods with high amounts of sugar, saturated fats, and salt. Take vitamin and mineral supplements as recommended by your health care provider. Do not drink alcohol if your health care provider tells you not to drink. If you drink alcohol: Limit how much you have to 0-2 drinks a day. Know how much alcohol is in your drink. In the [place] one drink equals one 12 oz bottle of beer (355 mL), one 5 oz glass of wine (148 mL), or  one 1 oz glass of hard liquor (44 mL). Lifestyle Brush your teeth every morning and night with fluoride toothpaste. Floss one time each day. Exercise for at least 30 minutes 5 or more days each week. Do not use any products that contain nicotine or tobacco. These products include cigarettes, chewing tobacco, and vaping devices, such as e-cigarettes. If you need help quitting, ask your health care provider. Do not use drugs. If you are sexually active, practice safe sex. Use a condom or other form of protection to prevent STIs. Take aspirin only as told by your health care provider. Make sure that you understand how much to take and what form to take. Work with your health care provider to find out whether it is safe and beneficial for you to take aspirin daily. Ask your health care provider if you need to take a cholesterol-lowering medicine (statin). Find healthy ways to manage stress, such as: Meditation, yoga, or listening to music. Journaling. Talking to a trusted person. Spending time with friends and family. Safety Always wear your seat belt while driving or riding in a vehicle. Do not drive: If you have been drinking alcohol. Do not ride with someone who has been drinking. When you are tired or distracted. While texting. If you have been using any mind-altering substances or drugs. Wear a helmet and other protective equipment during sports activities. If you have firearms in your house, make sure you follow all gun safety procedures. Minimize exposure to UV radiation to reduce your risk of skin cancer. What's next? Visit your health care provider once a year for an annual wellness visit. Ask your health care provider how often you should have your eyes and teeth checked. Stay up to date on all vaccines. This information is not intended to replace advice given to  you by your health care provider. Make sure you discuss any questions you have with your health care provider. Document  Revised: 07/28/2020 Document Reviewed: 07/28/2020 Elsevier Patient Education  2024 [employer].     Signed,   Meredith Staggers, MD [facility], [facility] 09/28/22 9:21 AM

## 2022-09-28 NOTE — Patient Instructions (Addendum)
No med changes today.  I will let you know if there are concerns on labs. RSV vaccine option at your pharmacy - see info below.  COVID booster and flu vaccine should also be available at your pharmacy soon.  Let me know if there are questions. Take care!  There is a recommendation for RSV vaccine for patients over age 68.   Typically would recommend the RSV vaccine as most important for patients over age 45 that have comorbidities that put them at increased risk for severe disease (heart disease such as congestive heart failure, coronary artery disease, lung disease such as asthma or COPD, kidney disease, liver disease, diabetes, chronic or progressive neurologic or muscular conditions, immunosuppressed, or being frail or of advanced age).  For others that do not have these risk factors, there still is some benefit from vaccination since age is one of the main risk factors for developing severe disease however baseline risk of developing severe disease and requiring hospitalization is likely to be lower compared to those that have comorbidities in addition to age.   CDC does have some information as well: [employer]  Preventive Care 80 Years and Older, Male Preventive care refers to lifestyle choices and visits with your health care provider that can promote health and wellness. Preventive care visits are also called wellness exams. What can I expect for my preventive care visit? Counseling During your preventive care visit, your health care provider may ask about your: Medical history, including: Past medical problems. Family medical history. History of falls. Current health, including: Emotional well-being. Home life and relationship well-being. Sexual activity. Memory and ability to understand (cognition). Lifestyle, including: Alcohol, nicotine or tobacco, and drug use. Access to firearms. Diet, exercise, and sleep habits. Work and work  Astronomer. Sunscreen use. Safety issues such as seatbelt and bike helmet use. Physical exam Your health care provider will check your: Height and weight. These may be used to calculate your BMI (body mass index). BMI is a measurement that tells if you are at a healthy weight. Waist circumference. This measures the distance around your waistline. This measurement also tells if you are at a healthy weight and may help predict your risk of certain diseases, such as type 2 diabetes and high blood pressure. Heart rate and blood pressure. Body temperature. Skin for abnormal spots. What immunizations do I need?  Vaccines are usually given at various ages, according to a schedule. Your health care provider will recommend vaccines for you based on your age, medical history, and lifestyle or other factors, such as travel or where you work. What tests do I need? Screening Your health care provider may recommend screening tests for certain conditions. This may include: Lipid and cholesterol levels. Diabetes screening. This is done by checking your blood sugar (glucose) after you have not eaten for a while (fasting). Hepatitis C test. Hepatitis B test. HIV (human immunodeficiency virus) test. STI (sexually transmitted infection) testing, if you are at risk. Lung cancer screening. Colorectal cancer screening. Prostate cancer screening. Abdominal aortic aneurysm (AAA) screening. You may need this if you are a current or former smoker. Talk with your health care provider about your test results, treatment options, and if necessary, the need for more tests. Follow these instructions at home: Eating and drinking  Eat a diet that includes fresh fruits and vegetables, whole grains, lean protein, and low-fat dairy products. Limit your intake of foods with high amounts of sugar, saturated fats, and salt. Take vitamin and mineral supplements as  recommended by your health care provider. Do not drink  alcohol if your health care provider tells you not to drink. If you drink alcohol: Limit how much you have to 0-2 drinks a day. Know how much alcohol is in your drink. In the [place] one drink equals one 12 oz bottle of beer (355 mL), one 5 oz glass of wine (148 mL), or one 1 oz glass of hard liquor (44 mL). Lifestyle Brush your teeth every morning and night with fluoride toothpaste. Floss one time each day. Exercise for at least 30 minutes 5 or more days each week. Do not use any products that contain nicotine or tobacco. These products include cigarettes, chewing tobacco, and vaping devices, such as e-cigarettes. If you need help quitting, ask your health care provider. Do not use drugs. If you are sexually active, practice safe sex. Use a condom or other form of protection to prevent STIs. Take aspirin only as told by your health care provider. Make sure that you understand how much to take and what form to take. Work with your health care provider to find out whether it is safe and beneficial for you to take aspirin daily. Ask your health care provider if you need to take a cholesterol-lowering medicine (statin). Find healthy ways to manage stress, such as: Meditation, yoga, or listening to music. Journaling. Talking to a trusted person. Spending time with friends and family. Safety Always wear your seat belt while driving or riding in a vehicle. Do not drive: If you have been drinking alcohol. Do not ride with someone who has been drinking. When you are tired or distracted. While texting. If you have been using any mind-altering substances or drugs. Wear a helmet and other protective equipment during sports activities. If you have firearms in your house, make sure you follow all gun safety procedures. Minimize exposure to UV radiation to reduce your risk of skin cancer. What's next? Visit your health care provider once a year for an annual wellness visit. Ask your health care  provider how often you should have your eyes and teeth checked. Stay up to date on all vaccines. This information is not intended to replace advice given to you by your health care provider. Make sure you discuss any questions you have with your health care provider. Document Revised: 07/28/2020 Document Reviewed: 07/28/2020 Elsevier Patient Education  2024 [employer].

## 2022-09-29 LAB — HEPATITIS C ANTIBODY: Hepatitis C Ab: NONREACTIVE

## 2022-12-13 DIAGNOSIS — Z008 Encounter for other general examination: Secondary | ICD-10-CM | POA: Diagnosis not present

## 2022-12-13 DIAGNOSIS — R001 Bradycardia, unspecified: Secondary | ICD-10-CM | POA: Diagnosis not present

## 2022-12-13 DIAGNOSIS — E785 Hyperlipidemia, unspecified: Secondary | ICD-10-CM | POA: Diagnosis not present

## 2022-12-13 DIAGNOSIS — Z8249 Family history of ischemic heart disease and other diseases of the circulatory system: Secondary | ICD-10-CM | POA: Diagnosis not present

## 2022-12-13 DIAGNOSIS — Z85828 Personal history of other malignant neoplasm of skin: Secondary | ICD-10-CM | POA: Diagnosis not present

## 2022-12-13 DIAGNOSIS — H269 Unspecified cataract: Secondary | ICD-10-CM | POA: Diagnosis not present

## 2022-12-13 DIAGNOSIS — I739 Peripheral vascular disease, unspecified: Secondary | ICD-10-CM | POA: Diagnosis not present

## 2022-12-13 DIAGNOSIS — I10 Essential (primary) hypertension: Secondary | ICD-10-CM | POA: Diagnosis not present

## 2022-12-13 DIAGNOSIS — Z833 Family history of diabetes mellitus: Secondary | ICD-10-CM | POA: Diagnosis not present

## 2022-12-29 ENCOUNTER — Ambulatory Visit: Payer: Self-pay | Admitting: Cardiology

## 2023-01-03 ENCOUNTER — Ambulatory Visit: Payer: Medicare HMO | Admitting: Cardiology

## 2023-01-19 ENCOUNTER — Ambulatory Visit: Payer: Medicare HMO | Attending: Cardiology | Admitting: Cardiology

## 2023-01-19 ENCOUNTER — Encounter: Payer: Self-pay | Admitting: Cardiology

## 2023-01-19 VITALS — BP 100/58 | HR 56 | Resp 16 | Ht 69.0 in | Wt 178.4 lb

## 2023-01-19 DIAGNOSIS — E781 Pure hyperglyceridemia: Secondary | ICD-10-CM

## 2023-01-19 DIAGNOSIS — I493 Ventricular premature depolarization: Secondary | ICD-10-CM

## 2023-01-19 MED ORDER — ATORVASTATIN CALCIUM 10 MG PO TABS
10.0000 mg | ORAL_TABLET | Freq: Every day | ORAL | 3 refills | Status: DC
Start: 2023-01-19 — End: 2024-01-04

## 2023-01-19 MED ORDER — METOPROLOL TARTRATE 25 MG PO TABS
25.0000 mg | ORAL_TABLET | Freq: Three times a day (TID) | ORAL | 3 refills | Status: AC
Start: 2023-01-19 — End: 2023-10-04

## 2023-01-19 NOTE — Progress Notes (Signed)
  Cardiology Office Note:  .   Date:  01/19/2023  ID:  David [name], DOB 29-Sep-1955, MRN [id number] PCP: Shade Flood, MD  [facility] Providers Cardiologist:  Truett Mainland, MD PCP: Shade Flood, MD  Chief Complaint  Patient presents with   premature ventricular contraction   Follow-up      History of Present Illness: .    David [name] is a 67 y.o. male with symptomatic PVC, elevated triglyceride  Patient is doing very well with PVC standpoint.  Currently, he is taking metoprolol tartrate 50 mg twice daily.  He has noticed some degree of fatigue with higher dose of metoprolol.  Reviewed recent lipid panel results with the patient, details below.  Patient does admit to eating potatoes and rice frequently.  Vitals:   01/19/23 1358  BP: (!) 100/58  Pulse: (!) 56  Resp: 16  SpO2: 96%     ROS:  Review of Systems  Constitutional: Positive for malaise/fatigue.  Cardiovascular:  Negative for chest pain, dyspnea on exertion, leg swelling, palpitations and syncope.     Studies Reviewed: Marland Kitchen         Independently interpreted 09/2022: Chol 154, TG 249, HDL 39, LDL 52 HbA1C 5.4% Hb 16.1 Cr 1.0  Physical Exam:   Physical Exam Vitals and nursing note reviewed.  Constitutional:      General: He is not in acute distress. Neck:     Vascular: No JVD.  Cardiovascular:     Rate and Rhythm: Normal rate and regular rhythm.     Heart sounds: Normal heart sounds. No murmur heard. Pulmonary:     Effort: Pulmonary effort is normal.     Breath sounds: Normal breath sounds. No wheezing or rales.  Musculoskeletal:     Right lower leg: No edema.     Left lower leg: No edema.      VISIT DIAGNOSES:   ICD-10-CM   1. PVC (premature ventricular contraction)  I49.3 metoprolol tartrate (LOPRESSOR) 25 MG tablet    2. Hypertriglyceridemia  E78.1 atorvastatin (LIPITOR) 10 MG tablet    Lipid panel    Lipid panel       ASSESSMENT AND PLAN: .    David [name] is a 67 y.o. male with symptomatic PVC   Symptomatic PVC: Symptoms well-controlled on metoprolol tartrate. He has noticed fatigue with Metoprolol 50 mg twice daily.   Reduce to 25 mg tid.  Hypertriglyceridemia: Chol 154, TG 249, HDL 39, LDL 52 (09/2022) TG remain elevated, currently 249. Discussed reducing intake of potatoes and rice. Repeat fasting lipid panel in 4-5 months.      Meds ordered this encounter  Medications   metoprolol tartrate (LOPRESSOR) 25 MG tablet    Sig: Take 1 tablet (25 mg total) by mouth in the morning, at noon, and at bedtime.    Dispense:  270 tablet    Refill:  3   atorvastatin (LIPITOR) 10 MG tablet    Sig: Take 1 tablet (10 mg total) by mouth daily.    Dispense:  90 tablet    Refill:  3     F/u in 6 months  Signed, Elder Negus, MD

## 2023-01-19 NOTE — Patient Instructions (Signed)
Medication Instructions:  Please take 25 mg metoprolol tartrate 3 times a day.  *If you need a refill on your cardiac medications before your next appointment, please call your pharmacy*   Lab Work: Please complete a FASTING lipid panel at any [facility] within 3-6 months.  If you have labs (blood work) drawn today and your tests are completely normal, you will receive your results only by: MyChart Message (if you have MyChart) OR A paper copy in the mail If you have any lab test that is abnormal or we need to change your treatment, we will call you to review the results.   Testing/Procedures: None.   Follow-Up:  Your next appointment:   6 month(s)  Provider:   Dr. Judie Petit. Rosemary Holms, MD

## 2023-03-08 DIAGNOSIS — H43392 Other vitreous opacities, left eye: Secondary | ICD-10-CM | POA: Diagnosis not present

## 2023-03-08 DIAGNOSIS — Z961 Presence of intraocular lens: Secondary | ICD-10-CM | POA: Diagnosis not present

## 2023-04-16 ENCOUNTER — Encounter: Payer: Self-pay | Admitting: Family Medicine

## 2023-04-16 ENCOUNTER — Ambulatory Visit: Payer: Medicare HMO | Admitting: Family Medicine

## 2023-04-16 VITALS — BP 110/68 | HR 78 | Temp 97.8°F | Ht 69.0 in

## 2023-04-16 DIAGNOSIS — H6992 Unspecified Eustachian tube disorder, left ear: Secondary | ICD-10-CM | POA: Diagnosis not present

## 2023-04-16 MED ORDER — FLUTICASONE PROPIONATE 50 MCG/ACT NA SUSP: 2.0000 | Freq: Every day | NASAL | 6 refills | Status: DC

## 2023-04-16 NOTE — Progress Notes (Signed)
   Subjective:    Patient ID: David [name], male    DOB: 19-Nov-1955, 68 y.o.   MRN: [id number]  HPI L ear fullness- pt reports recurrent issues w/ ear wax in L ear.  Typically uses debrox w/ good relief.  As of 1 week ago, pt notes muffled hearing, ringing, and fullness of L ear.   Review of Systems For ROS see HPI     Objective:   Physical Exam Vitals reviewed.  Constitutional:      General: He is not in acute distress.    Appearance: Normal appearance. He is not ill-appearing.  HENT:     Head: Normocephalic and atraumatic.     Right Ear: Tympanic membrane and ear canal normal.     Left Ear: Ear canal normal. Tympanic membrane is retracted. Tympanic membrane is not erythematous.     Nose: Congestion (mild) present. No rhinorrhea.  Eyes:     Extraocular Movements: Extraocular movements intact.     Conjunctiva/sclera: Conjunctivae normal.  Cardiovascular:     Rate and Rhythm: Normal rate.  Pulmonary:     Effort: Pulmonary effort is normal. No respiratory distress.  Musculoskeletal:     Cervical back: Neck supple.  Lymphadenopathy:     Cervical: No cervical adenopathy.  Skin:    General: Skin is warm and dry.  Neurological:     General: No focal deficit present.     Mental Status: He is alert and oriented to person, place, and time.  Psychiatric:        Mood and Affect: Mood normal.        Behavior: Behavior normal.        Thought Content: Thought content normal.           Assessment & Plan:  L eustachian tube dysfxn- new.  Reviewed dx w/ pt and supportive measures to improve his sxs.  Start Flonase daily and add OTC antihistamine prn.  Pt expressed understanding and is in agreement w/ plan.

## 2023-04-16 NOTE — Patient Instructions (Signed)
 Follow up as needed or as scheduled START the Fluticasone nasal spray- 2 sprays each nostril daily Drink lots of fluids Consider a daily Claritin or Zyrtec to help w/ congestion Call with any questions or concerns [place]!!

## 2023-05-01 ENCOUNTER — Telehealth: Payer: Self-pay

## 2023-05-01 NOTE — Telephone Encounter (Signed)
 Copied from CRM [id number]. Topic: General - Other >> May 01, 2023  3:50 PM David [name] T wrote: Reason for CRM: patient is still having ear issues the medication he was prescribed did not help he would like a call back regarding this

## 2023-05-02 ENCOUNTER — Encounter: Payer: Self-pay | Admitting: Family Medicine

## 2023-05-02 ENCOUNTER — Ambulatory Visit (INDEPENDENT_AMBULATORY_CARE_PROVIDER_SITE_OTHER): Admitting: Family Medicine

## 2023-05-02 VITALS — BP 108/62 | HR 68 | Temp 98.3°F | Ht 69.0 in

## 2023-05-02 DIAGNOSIS — H6992 Unspecified Eustachian tube disorder, left ear: Secondary | ICD-10-CM | POA: Diagnosis not present

## 2023-05-02 MED ORDER — PREDNISONE 10 MG PO TABS: ORAL_TABLET | ORAL | 0 refills | Status: DC

## 2023-05-02 NOTE — Telephone Encounter (Signed)
 Since pt was seen over 2 weeks ago, it is best to have the situation re-evaluated w/ another office visit.  He can be scheduled at his convenience

## 2023-05-02 NOTE — Progress Notes (Signed)
   Subjective:    Patient ID: David [name], male    DOB: 12/14/1955, 68 y.o.   MRN: [id number]  HPI Decreased hearing- pt was seen on 3/3 and was dx'd w/ eustachian tube dysfxn.  Started on daily antihistamine and nasal steroid.  No change in sxs.  Continues to be L sided, no issues on R.  No pain.  No fever.  No drainage.   Review of Systems For ROS see HPI     Objective:   Physical Exam Vitals reviewed.  Constitutional:      General: He is not in acute distress.    Appearance: Normal appearance. He is not ill-appearing.  HENT:     Head: Normocephalic and atraumatic.     Right Ear: Tympanic membrane and ear canal normal.     Ears:     Comments: L TM retracted    Nose: No congestion.     Comments: No TTP over frontal or maxillary sinuses    Mouth/Throat:     Mouth: Mucous membranes are moist.     Pharynx: No posterior oropharyngeal erythema.  Eyes:     Extraocular Movements: Extraocular movements intact.     Conjunctiva/sclera: Conjunctivae normal.  Cardiovascular:     Rate and Rhythm: Normal rate and regular rhythm.  Musculoskeletal:     Cervical back: Neck supple.  Lymphadenopathy:     Cervical: No cervical adenopathy.  Skin:    General: Skin is warm and dry.  Neurological:     General: No focal deficit present.     Mental Status: He is alert and oriented to person, place, and time.  Psychiatric:        Mood and Affect: Mood normal.        Behavior: Behavior normal.        Thought Content: Thought content normal.           Assessment & Plan:  L eustachian tube dysfxn- ongoing.  No relief w/ daily antihistamine or nasal steroid.  Will move to systemic steroids in hopes of decreasing congestion/inflammation and opening tube.  If no improvement after Prednisone taper, will refer to ENT for possible tube placement.  Pt expressed understanding and is in agreement w/ plan.

## 2023-05-02 NOTE — Telephone Encounter (Signed)
 Left vm to call office to make appt to be seen.  If PCP does not have appt  please make appt with another [facility] provider

## 2023-05-02 NOTE — Telephone Encounter (Signed)
 Pt has appt 05/02/2023

## 2023-05-02 NOTE — Patient Instructions (Signed)
 Follow up as needed or as scheduled START the Prednisone as directed- 3 pills at the same time x3 days, then 2 pills at the same time x3 days, then 1 pill daily.  Take w/ food  Continue the nasal spray and allergy pill daily Drink LOTS of water Call with any questions or concerns If not improving, next step is an ENT referral Hang in there!!

## 2023-05-14 ENCOUNTER — Telehealth: Payer: Self-pay | Admitting: Family Medicine

## 2023-05-14 DIAGNOSIS — H6992 Unspecified Eustachian tube disorder, left ear: Secondary | ICD-10-CM

## 2023-05-14 NOTE — Telephone Encounter (Signed)
 Copied from CRM [id number]. Topic: Referral - Question >> May 14, 2023  9:00 AM David [name] wrote:   Reason for CRM: Patient called stating he previously seen Dr. Beverely Low for an ear issue, patient states provider was going to refer him to an ear specialist if issue hadn't resolved after taking medication and patient states the issue has not resolved and wants to Dr. Beverely Low to refer him to the ear specialist she mentioned patient isn't sure of any details of where she was referring him too, please reach out to patient, thanks.  David [name] [phone]

## 2023-05-14 NOTE — Telephone Encounter (Signed)
 Patient recently 05/02/2023 was seen for ear issues, requesting referral that was discussed at this appointment. Patient is wanting to be referred to specialist you recommended at appointment. Is this referral okay to place?

## 2023-05-14 NOTE — Telephone Encounter (Signed)
 Caller name: Zaccary Creech number: [phone]  Reason for Referral Request: Ear Issues  Has patient been seen PCP for this complaint? Yes, saw Dr. Beverely Low on 05/02/23  (If no,  please schedule patient for appointment for complaint.)  Patient scheduled on:   (If yes, please find out following information.)  Referral for which specialty: Not specified  Preferred office/provider: Not specified

## 2023-05-14 NOTE — Telephone Encounter (Signed)
 Referral placed to Dr Suszanne Conners- ENT

## 2023-05-14 NOTE — Telephone Encounter (Signed)
 Called patient to let him know the referral was placed and he should hear from them soon.

## 2023-05-14 NOTE — Addendum Note (Signed)
 Addended by: Sheliah Hatch on: 05/14/2023 12:45 PM   Modules accepted: Orders

## 2023-05-18 ENCOUNTER — Encounter (INDEPENDENT_AMBULATORY_CARE_PROVIDER_SITE_OTHER): Payer: Self-pay | Admitting: Otolaryngology

## 2023-05-21 DIAGNOSIS — H903 Sensorineural hearing loss, bilateral: Secondary | ICD-10-CM | POA: Diagnosis not present

## 2023-05-21 DIAGNOSIS — H6992 Unspecified Eustachian tube disorder, left ear: Secondary | ICD-10-CM | POA: Diagnosis not present

## 2023-10-04 ENCOUNTER — Ambulatory Visit (INDEPENDENT_AMBULATORY_CARE_PROVIDER_SITE_OTHER): Payer: Medicare HMO | Admitting: Family Medicine

## 2023-10-04 ENCOUNTER — Encounter: Payer: Self-pay | Admitting: Family Medicine

## 2023-10-04 VITALS — BP 112/76 | HR 54 | Temp 98.1°F | Resp 13 | Ht 69.0 in | Wt 170.4 lb

## 2023-10-04 DIAGNOSIS — E785 Hyperlipidemia, unspecified: Secondary | ICD-10-CM | POA: Diagnosis not present

## 2023-10-04 DIAGNOSIS — I493 Ventricular premature depolarization: Secondary | ICD-10-CM

## 2023-10-04 DIAGNOSIS — Z125 Encounter for screening for malignant neoplasm of prostate: Secondary | ICD-10-CM

## 2023-10-04 DIAGNOSIS — Z0000 Encounter for general adult medical examination without abnormal findings: Secondary | ICD-10-CM | POA: Diagnosis not present

## 2023-10-04 LAB — LIPID PANEL
Cholesterol: 149 mg/dL (ref 0–200)
HDL: 39.4 mg/dL (ref >39.00)
LDL Cholesterol: 66 mg/dL (ref 0–99)
NonHDL: 109.79
Total CHOL/HDL Ratio: 4
Triglycerides: 220 mg/dL — ABNORMAL HIGH (ref 0.0–149.0)
VLDL: 44 mg/dL — ABNORMAL HIGH (ref 0.0–40.0)

## 2023-10-04 LAB — COMPREHENSIVE METABOLIC PANEL WITH GFR
ALT: 24 U/L (ref 0–53)
AST: 17 U/L (ref 0–37)
Albumin: 4.6 g/dL (ref 3.5–5.2)
Alkaline Phosphatase: 44 U/L (ref 39–117)
BUN: 14 mg/dL (ref 6–23)
CO2: 30 meq/L (ref 19–32)
Calcium: 9.3 mg/dL (ref 8.4–10.5)
Chloride: 102 meq/L (ref 96–112)
Creatinine, Ser: 0.97 mg/dL (ref 0.40–1.50)
GFR: 79.89 mL/min (ref >60.00)
Glucose, Bld: 79 mg/dL (ref 70–99)
Potassium: 4.6 meq/L (ref 3.5–5.1)
Sodium: 140 meq/L (ref 135–145)
Total Bilirubin: 0.6 mg/dL (ref 0.2–1.2)
Total Protein: 6.8 g/dL (ref 6.0–8.3)

## 2023-10-04 LAB — PSA: PSA: 1.5 ng/mL (ref 0.10–4.00)

## 2023-10-04 NOTE — Patient Instructions (Signed)
 Thank you for coming in today. No change in medications at this time. If there are any concerns on your bloodwork, I will let you know.  Keep follow-up with specialist as planned including discussion of the PVCs, metoprolol  dosing.  Let me know if there are questions and take care!  Preventive Care 75 Years and Older, Male Preventive care refers to lifestyle choices and visits with your health care provider that can promote health and wellness. Preventive care visits are also called wellness exams. What can I expect for my preventive care visit? Counseling During your preventive care visit, your health care provider may ask about your: Medical history, including: Past medical problems. Family medical history. History of falls. Current health, including: Emotional well-being. Home life and relationship well-being. Sexual activity. Memory and ability to understand (cognition). Lifestyle, including: Alcohol, nicotine or tobacco, and drug use. Access to firearms. Diet, exercise, and sleep habits. Work and work Astronomer. Sunscreen use. Safety issues such as seatbelt and bike helmet use. Physical exam Your health care provider will check your: Height and weight. These may be used to calculate your BMI (body mass index). BMI is a measurement that tells if you are at a healthy weight. Waist circumference. This measures the distance around your waistline. This measurement also tells if you are at a healthy weight and may help predict your risk of certain diseases, such as type 2 diabetes and high blood pressure. Heart rate and blood pressure. Body temperature. Skin for abnormal spots. What immunizations do I need?  Vaccines are usually given at various ages, according to a schedule. Your health care provider will recommend vaccines for you based on your age, medical history, and lifestyle or other factors, such as travel or where you work. What tests do I need? Screening Your health care  provider may recommend screening tests for certain conditions. This may include: Lipid and cholesterol levels. Diabetes screening. This is done by checking your blood sugar (glucose) after you have not eaten for a while (fasting). Hepatitis C test. Hepatitis B test. HIV (human immunodeficiency virus) test. STI (sexually transmitted infection) testing, if you are at risk. Lung cancer screening. Colorectal cancer screening. Prostate cancer screening. Abdominal aortic aneurysm (AAA) screening. You may need this if you are a current or former smoker. Talk with your health care provider about your test results, treatment options, and if necessary, the need for more tests. Follow these instructions at home: Eating and drinking  Eat a diet that includes fresh fruits and vegetables, whole grains, lean protein, and low-fat dairy products. Limit your intake of foods with high amounts of sugar, saturated fats, and salt. Take vitamin and mineral supplements as recommended by your health care provider. Do not drink alcohol if your health care provider tells you not to drink. If you drink alcohol: Limit how much you have to 0-2 drinks a day. Know how much alcohol is in your drink. In the [place] one drink equals one 12 oz bottle of beer (355 mL), one 5 oz glass of wine (148 mL), or one 1 oz glass of hard liquor (44 mL). Lifestyle Brush your teeth every morning and night with fluoride toothpaste. Floss one time each day. Exercise for at least 30 minutes 5 or more days each week. Do not use any products that contain nicotine or tobacco. These products include cigarettes, chewing tobacco, and vaping devices, such as e-cigarettes. If you need help quitting, ask your health care provider. Do not use drugs. If you are sexually  active, practice safe sex. Use a condom or other form of protection to prevent STIs. Take aspirin only as told by your health care provider. Make sure that you understand how much to  take and what form to take. Work with your health care provider to find out whether it is safe and beneficial for you to take aspirin daily. Ask your health care provider if you need to take a cholesterol-lowering medicine (statin). Find healthy ways to manage stress, such as: Meditation, yoga, or listening to music. Journaling. Talking to a trusted person. Spending time with friends and family. Safety Always wear your seat belt while driving or riding in a vehicle. Do not drive: If you have been drinking alcohol. Do not ride with someone who has been drinking. When you are tired or distracted. While texting. If you have been using any mind-altering substances or drugs. Wear a helmet and other protective equipment during sports activities. If you have firearms in your house, make sure you follow all gun safety procedures. Minimize exposure to UV radiation to reduce your risk of skin cancer. What's next? Visit your health care provider once a year for an annual wellness visit. Ask your health care provider how often you should have your eyes and teeth checked. Stay up to date on all vaccines. This information is not intended to replace advice given to you by your health care provider. Make sure you discuss any questions you have with your health care provider. Document Revised: 07/28/2020 Document Reviewed: 07/28/2020 Elsevier Patient Education  2024 [employer].

## 2023-10-04 NOTE — Progress Notes (Signed)
 Subjective:  Patient ID: David [name], male    DOB: 20-Apr-1955  Age: 68 y.o. MRN: [id number]  CC:  Chief Complaint  Patient presents with   Annual Exam    Pt has no questions or concerns.     HPI [name] presents for Annual Exam  No health changes.   PCP, [facility], Dr. Elmira, history of PVCs, hypertriglyceridemia, appointment December 2024.  Metoprolol  25 mg 3 times daily (fatigue with 50 mg twice daily) Lipitor for hypertriglyceridemia.  Taking metoprolol  once per day. Not sure if helping, intermittent sx's, notices with strenuous exercise. No new chest pains.  Audiology, [name], sensorineural hearing loss bilateral, left-sided tinnitus ENT, Dr. [name].  Eustachian tube dysfunction, sensorineural hearing loss.  Defers hearing aids for now.  Optho, Dr. [name] [facility], [name]  Hyperlipidemia: Lipitor 10 mg daily, without any side effect/myalgias.  Fasting today.  Lab Results  Component Value Date   CHOL 154 09/28/2022   HDL 39.30 09/28/2022   LDLCALC 52 09/17/2020   LDLDIRECT 66.0 09/28/2022   TRIG 249.0 (H) 09/28/2022   CHOLHDL 4 09/28/2022   Lab Results  Component Value Date   ALT 24 09/28/2022   AST 18 09/28/2022   ALKPHOS 40 09/28/2022   BILITOT 0.5 09/28/2022         10/04/2023    9:43 AM 05/02/2023    2:19 PM 09/28/2022    8:50 AM 09/23/2021    8:09 AM 11/10/2020    9:04 AM  Depression screen PHQ 2/9  Decreased Interest 0 0 0 0 1  Down, Depressed, Hopeless 0 0 0 0 0  PHQ - 2 Score 0 0 0 0 1  Altered sleeping 0 0 1 1   Tired, decreased energy 0 0 0 1   Change in appetite 0 0 0 0   Feeling bad or failure about yourself  0 0 0 0   Trouble concentrating 0 0 0 0   Moving slowly or fidgety/restless 0 0 0 0   Suicidal thoughts 0 0 0 0   PHQ-9 Score 0 0 1 2   Difficult doing work/chores Not difficult at all Not difficult at all       Health Maintenance  Topic Date Due   Zoster Vaccines- Shingrix (1 of 2) Never  done   Medicare Annual Wellness (AWV)  09/17/2021   COVID-19 Vaccine (4 - 2024-25 season) 10/20/2023 (Originally 10/15/2022)   DTaP/Tdap/Td (1 - Tdap) 10/03/2024 (Originally 09/04/1973)   Fecal DNA (Cologuard)  10/08/2023   Pneumococcal Vaccine: 50+ Years  Completed   Hepatitis C Screening  Completed   HPV VACCINES  Aged Out   Meningococcal B Vaccine  Aged Out   INFLUENZA VACCINE  Discontinued  Cologuard negative in August 2022. Screening options with colonoscopy versus Cologuard discussed. Discussed timing of repeat testing intervals if normal, as well as potential need for diagnostic Colonoscopy if positive Cologuard. Understanding expressed, and chose Cologuard.   Prostate: does not have family history of prostate cancer The natural history of prostate cancer and ongoing controversy regarding screening and potential treatment outcomes of prostate cancer has been discussed with the patient. The meaning of a false positive PSA and a false negative PSA has been discussed. He indicates understanding of the limitations of this screening test and wishes  to proceed with screening PSA testing. Lab Results  Component Value Date   PSA1 1.3 03/19/2020   PSA1 1.3 04/22/2019   PSA 1.70 09/28/2022   PSA  0.87 09/23/2021    Immunization History  Administered Date(s) Administered   Fluad Quad(high Dose 65+) 03/19/2020, 10/29/2021   Influenza-Unspecified 12/29/2020   Moderna Sars-Covid-2 Vaccination 09/10/2019, 10/09/2019, 03/12/2020   Pneumococcal Conjugate-13 09/23/2021   Pneumococcal Polysaccharide-23 03/19/2020  He has declined Shingrix.  Flu and COVID boosters recommended in fall. Plans flu vaccine at pharmacy.  We have also discussed option of RSV vaccine.   No results found. Optho eval earlier in the year. No concerns.   Dental:few weeks ago, ongoing care Q20mo  Alcohol: none  Tobacco: none  Exercise: biking, keeping up property, yardwork - large acreage property.  Lab Results   Component Value Date   HGBA1C 5.4 09/17/2020     History Patient Active Problem List   Diagnosis Date Noted   PVC (premature ventricular contraction) 01/03/2021   Palpitations 11/23/2020   Hypertriglyceridemia 09/17/2020   Past Medical History:  Diagnosis Date   Cataract    Phreesia 03/16/2020   Nodular basal cell carcinoma (BCC) 03/24/2019   Mid Paraspinal ([id number])   Nodular basal cell carcinoma (BCC) 03/24/2019   Right Tragus(MOHS)   Past Surgical History:  Procedure Laterality Date   EYE SURGERY N/A    Phreesia 03/16/2020   NOSE SURGERY     No Known Allergies Prior to Admission medications   Medication Sig Start Date End Date Taking? Authorizing Provider  atorvastatin  (LIPITOR) 10 MG tablet Take 1 tablet (10 mg total) by mouth daily. 01/19/23  Yes Patwardhan, Newman PARAS, MD  metoprolol  tartrate (LOPRESSOR ) 25 MG tablet Take 1 tablet (25 mg total) by mouth in the morning, at noon, and at bedtime. 01/19/23 10/04/23 Yes Patwardhan, Manish J, MD  Multiple Vitamin (MULTIVITAMIN) tablet Take 1 tablet by mouth daily.   Yes [provider]  fluticasone  (FLONASE ) 50 MCG/ACT nasal spray Place 2 sprays into both nostrils daily. Patient not taking: Reported on 10/04/2023 04/16/23   Tabori, Katherine E, MD  predniSONE  (DELTASONE ) 10 MG tablet 3 tabs x3 days and then 2 tabs x3 days and then 1 tab x3 days.  Take w/ food. Patient not taking: Reported on 10/04/2023 05/02/23   Mahlon Comer BRAVO, MD   Social History   Socioeconomic History   Marital status: Married    Spouse name: Not on file   Number of children: 1   Years of education: Not on file   Highest education level: Some college, no degree  Occupational History   Not on file  Tobacco Use   Smoking status: Never   Smokeless tobacco: Never  Vaping Use   Vaping status: Never Used  Substance and Sexual Activity   Alcohol use: Never   Drug use: Never   Sexual activity: Yes  Other Topics Concern   Not on file  Social  History Narrative   Not on file   Social Drivers of Health   Financial Resource Strain: Low Risk  (10/01/2023)   Overall Financial Resource Strain (CARDIA)    Difficulty of Paying Living Expenses: Not hard at all  Food Insecurity: No Food Insecurity (10/01/2023)   Hunger Vital Sign    Worried About Running Out of Food in the Last Year: Never true    Ran Out of Food in the Last Year: Never true  Transportation Needs: No Transportation Needs (10/01/2023)   PRAPARE - Administrator, Civil Service (Medical): No    Lack of Transportation (Non-Medical): No  Physical Activity: Insufficiently Active (10/01/2023)   Exercise Vital Sign    Days of  Exercise per Week: 3 days    Minutes of Exercise per Session: 30 min  Stress: No Stress Concern Present (10/01/2023)   [employer] of Occupational Health - Occupational Stress Questionnaire    Feeling of Stress: Not at all  Social Connections: Moderately Isolated (10/01/2023)   Social Connection and Isolation Panel    Frequency of Communication with Friends and Family: Three times a week    Frequency of Social Gatherings with Friends and Family: Once a week    Attends Religious Services: Never    Database administrator or Organizations: No    Attends Engineer, structural: Not on file    Marital Status: Married  Catering manager Violence: Not on file    Review of Systems  13 point review of systems per patient health survey noted.  Negative other than as indicated above or in HPI.   Objective:   Vitals:   10/04/23 0938  BP: 112/76  Pulse: (!) 54  Resp: 13  Temp: 98.1 F (36.7 C)  TempSrc: Temporal  SpO2: 95%  Weight: 170 lb 6.4 oz (77.3 kg)  Height: 5' 9 (1.753 m)     Physical Exam Vitals reviewed.  Constitutional:      Appearance: He is well-developed.  HENT:     Head: Normocephalic and atraumatic.     Right Ear: External ear normal.     Left Ear: External ear normal.  Eyes:     Conjunctiva/sclera:  Conjunctivae normal.     Pupils: Pupils are equal, round, and reactive to light.  Neck:     Thyroid : No thyromegaly.  Cardiovascular:     Rate and Rhythm: Normal rate and regular rhythm.     Heart sounds: Normal heart sounds.  Pulmonary:     Effort: Pulmonary effort is normal. No respiratory distress.     Breath sounds: Normal breath sounds. No wheezing.  Abdominal:     General: There is no distension.     Palpations: Abdomen is soft.     Tenderness: There is no abdominal tenderness.  Musculoskeletal:        General: No tenderness. Normal range of motion.     Cervical back: Normal range of motion and neck supple.  Lymphadenopathy:     Cervical: No cervical adenopathy.  Skin:    General: Skin is warm and dry.  Neurological:     Mental Status: He is alert and oriented to person, place, and time.     Deep Tendon Reflexes: Reflexes are normal and symmetric.  Psychiatric:        Behavior: Behavior normal.        Assessment & Plan:  KALLIN HENK is a 67 y.o. male . Annual physical exam  - -anticipatory guidance as below in AVS, screening labs above. Health maintenance items as above in HPI discussed/recommended as applicable.   Hyperlipidemia, unspecified hyperlipidemia type - Plan: Comprehensive metabolic panel with GFR, Lipid panel  - Tolerating current dose Lipitor, check labs and adjust plan accordingly, continue follow-up with cardiology.  PVC (premature ventricular contraction)  - Symptoms, on low-dose metoprolol .  Recommended he discuss meds, treatment with cardiology.  Screening for malignant neoplasm of prostate - Plan: PSA   No orders of the defined types were placed in this encounter.  Patient Instructions  Thank you for coming in today. No change in medications at this time. If there are any concerns on your bloodwork, I will let you know.  Keep follow-up with specialist as planned including  discussion of the PVCs, metoprolol  dosing.  Let me know if there are  questions and take care!  Preventive Care 84 Years and Older, Male Preventive care refers to lifestyle choices and visits with your health care provider that can promote health and wellness. Preventive care visits are also called wellness exams. What can I expect for my preventive care visit? Counseling During your preventive care visit, your health care provider may ask about your: Medical history, including: Past medical problems. Family medical history. History of falls. Current health, including: Emotional well-being. Home life and relationship well-being. Sexual activity. Memory and ability to understand (cognition). Lifestyle, including: Alcohol, nicotine or tobacco, and drug use. Access to firearms. Diet, exercise, and sleep habits. Work and work Astronomer. Sunscreen use. Safety issues such as seatbelt and bike helmet use. Physical exam Your health care provider will check your: Height and weight. These may be used to calculate your BMI (body mass index). BMI is a measurement that tells if you are at a healthy weight. Waist circumference. This measures the distance around your waistline. This measurement also tells if you are at a healthy weight and may help predict your risk of certain diseases, such as type 2 diabetes and high blood pressure. Heart rate and blood pressure. Body temperature. Skin for abnormal spots. What immunizations do I need?  Vaccines are usually given at various ages, according to a schedule. Your health care provider will recommend vaccines for you based on your age, medical history, and lifestyle or other factors, such as travel or where you work. What tests do I need? Screening Your health care provider may recommend screening tests for certain conditions. This may include: Lipid and cholesterol levels. Diabetes screening. This is done by checking your blood sugar (glucose) after you have not eaten for a while (fasting). Hepatitis C  test. Hepatitis B test. HIV (human immunodeficiency virus) test. STI (sexually transmitted infection) testing, if you are at risk. Lung cancer screening. Colorectal cancer screening. Prostate cancer screening. Abdominal aortic aneurysm (AAA) screening. You may need this if you are a current or former smoker. Talk with your health care provider about your test results, treatment options, and if necessary, the need for more tests. Follow these instructions at home: Eating and drinking  Eat a diet that includes fresh fruits and vegetables, whole grains, lean protein, and low-fat dairy products. Limit your intake of foods with high amounts of sugar, saturated fats, and salt. Take vitamin and mineral supplements as recommended by your health care provider. Do not drink alcohol if your health care provider tells you not to drink. If you drink alcohol: Limit how much you have to 0-2 drinks a day. Know how much alcohol is in your drink. In the [place] one drink equals one 12 oz bottle of beer (355 mL), one 5 oz glass of wine (148 mL), or one 1 oz glass of hard liquor (44 mL). Lifestyle Brush your teeth every morning and night with fluoride toothpaste. Floss one time each day. Exercise for at least 30 minutes 5 or more days each week. Do not use any products that contain nicotine or tobacco. These products include cigarettes, chewing tobacco, and vaping devices, such as e-cigarettes. If you need help quitting, ask your health care provider. Do not use drugs. If you are sexually active, practice safe sex. Use a condom or other form of protection to prevent STIs. Take aspirin only as told by your health care provider. Make sure that you understand how much to  take and what form to take. Work with your health care provider to find out whether it is safe and beneficial for you to take aspirin daily. Ask your health care provider if you need to take a cholesterol-lowering medicine (statin). Find healthy  ways to manage stress, such as: Meditation, yoga, or listening to music. Journaling. Talking to a trusted person. Spending time with friends and family. Safety Always wear your seat belt while driving or riding in a vehicle. Do not drive: If you have been drinking alcohol. Do not ride with someone who has been drinking. When you are tired or distracted. While texting. If you have been using any mind-altering substances or drugs. Wear a helmet and other protective equipment during sports activities. If you have firearms in your house, make sure you follow all gun safety procedures. Minimize exposure to UV radiation to reduce your risk of skin cancer. What's next? Visit your health care provider once a year for an annual wellness visit. Ask your health care provider how often you should have your eyes and teeth checked. Stay up to date on all vaccines. This information is not intended to replace advice given to you by your health care provider. Make sure you discuss any questions you have with your health care provider. Document Revised: 07/28/2020 Document Reviewed: 07/28/2020 Elsevier Patient Education  2024 [employer].     Signed,   Reyes Pines, MD [facility], [facility] 10/04/23 10:09 AM

## 2023-10-09 ENCOUNTER — Ambulatory Visit: Payer: Self-pay | Admitting: Family Medicine

## 2024-01-03 ENCOUNTER — Other Ambulatory Visit: Payer: Self-pay | Admitting: Cardiology

## 2024-01-03 DIAGNOSIS — E781 Pure hyperglyceridemia: Secondary | ICD-10-CM

## 2024-10-06 ENCOUNTER — Encounter: Admitting: Family Medicine
